# Patient Record
Sex: Male | Born: 1995 | Race: White | Hispanic: No | Marital: Single | Smoking: Never smoker
Health system: Southern US, Community
[De-identification: ages and names within clinical notes are randomized; demographics above are authoritative.]

## PROBLEM LIST (undated history)

## (undated) DIAGNOSIS — F909 Attention-deficit hyperactivity disorder, unspecified type: Secondary | ICD-10-CM

## (undated) DIAGNOSIS — Z87442 Personal history of urinary calculi: Secondary | ICD-10-CM

## (undated) HISTORY — PX: NO PAST SURGERIES: SHX2092

---
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
## 2017-02-02 ENCOUNTER — Emergency Department
Admission: EM | Admit: 2017-02-02 | Discharge: 2017-02-02 | Disposition: A | Discharge status: Home / Self Care | Payer: 59 | Attending: Emergency Medicine | Admitting: Emergency Medicine

## 2017-02-02 ENCOUNTER — Emergency Department: Payer: 59

## 2017-02-02 ENCOUNTER — Encounter: Payer: Self-pay | Admitting: Emergency Medicine

## 2017-02-02 DIAGNOSIS — M25571 Pain in right ankle and joints of right foot: Secondary | ICD-10-CM | POA: Insufficient documentation

## 2017-02-02 DIAGNOSIS — Y929 Unspecified place or not applicable: Secondary | ICD-10-CM | POA: Diagnosis not present

## 2017-02-02 DIAGNOSIS — W010XXA Fall on same level from slipping, tripping and stumbling without subsequent striking against object, initial encounter: Secondary | ICD-10-CM | POA: Insufficient documentation

## 2017-02-02 DIAGNOSIS — Z79899 Other long term (current) drug therapy: Secondary | ICD-10-CM | POA: Diagnosis not present

## 2017-02-02 DIAGNOSIS — Y93K1 Activity, walking an animal: Secondary | ICD-10-CM | POA: Diagnosis not present

## 2017-02-02 DIAGNOSIS — Y998 Other external cause status: Secondary | ICD-10-CM | POA: Diagnosis not present

## 2017-02-02 MED ORDER — NAPROXEN 500 MG PO TABS
500.0000 mg | ORAL_TABLET | Freq: Once | ORAL | Status: AC
  Administered 2017-02-02: 500 mg via ORAL
  Filled 2017-02-02: qty 1

## 2017-02-02 NOTE — ED Triage Notes (Signed)
Pt with right ankle pain after rolling it yesterday while walking a dog. Increased pain when bearing weight.

## 2017-02-02 NOTE — Discharge Instructions (Signed)
Continue taking over-the-counter ibuprofen, Motrin or Aleve as distracted, as needed for pain and inflammation.

## 2017-02-02 NOTE — ED Provider Notes (Signed)
[facility] Emergency Department Provider Note   ____________________________________________   I have reviewed the triage vital signs and the nursing notes.   HISTORY  Chief Complaint Ankle Pain    HPI Jeffery Everett is a 21 y.o. male presents emergency Department with right ankle swelling and pain after falling while walking a neighbors dog yesterday. Patient described a plantar flexion with inversion motion of the ankle during the misstep and fall. Since the injury patient notes pain at rest that increases with ankle motion and weightbearing. He localizes his pain to the lateral aspect of the ankle. Patient denies any past history of ankle sprains or fractures. Patient denies other injury sustained during the fall. Patient denies fever, chills, headache, vision changes, chest pain, chest tightness, shortness of breath, abdominal pain, nausea and vomiting.  Past Medical History:  Diagnosis Date  . ADHD (attention deficit hyperactivity disorder)     Patient Active Problem List   Diagnosis Date Noted  . Pilonidal cyst with abscess 11/25/2015    Past Surgical History:  Procedure Laterality Date  . NO PAST SURGERIES     Verified with patient (11/25/15)    Prior to Admission medications   Medication Sig Start Date End Date Taking? Authorizing Provider  amphetamine-dextroamphetamine (ADDERALL XR) 20 MG 24 hr capsule Take 20 mg by mouth daily.    [provider]  cetirizine (ZYRTEC) 10 MG tablet Take 10 mg by mouth every morning. 10/20/15   [provider]  clindamycin (CLEOCIN) 150 MG capsule Take 1 capsule by mouth 3 (three) times daily. 11/23/15   [provider]  fluticasone (FLONASE) 50 MCG/ACT nasal spray Place 1 spray into both nostrils daily. 10/20/15   [provider]  ibuprofen (ADVIL,MOTRIN) 600 MG tablet Take 1 tablet (600 mg total) by mouth every 6 (six) hours as needed. 05/21/15   Triplett, Rulon Eisenmengerari B, FNP    naproxen (NAPROSYN) 500 MG tablet Take 1 tablet (500 mg total) by mouth 2 (two) times daily with a meal. 12/29/15   Hagler, Jami L, PA-C    Allergies Patient has no known allergies.  Family History  Problem Relation Age of Onset  . Hypertension Mother   . ADD / ADHD Father   . Hypertension Paternal Grandmother   . Bipolar disorder Paternal Grandmother   . Fibromyalgia Paternal Grandmother   . COPD Paternal Grandmother   . Heart disease Paternal Grandfather     Social History Social History  Substance Use Topics  . Smoking status: Never Smoker  . Smokeless tobacco: Never Used  . Alcohol use No    Review of Systems Constitutional: Negative for fever/chills Eyes: No visual changes. Cardiovascular: Denies chest pain. Respiratory: Denies shortness of breath. Musculoskeletal: Right ankle pain and swelling.  Skin: Negative for rash. Neurological: Negative for headaches. ____________________________________________   PHYSICAL EXAM:  VITAL SIGNS: ED Triage Vitals  Enc Vitals Group     BP 02/02/17 1206 (!) 125/96     Pulse Rate 02/02/17 1206 77     Resp 02/02/17 1206 20     Temp 02/02/17 1206 98.3 F (36.8 C)     Temp Source 02/02/17 1206 Oral     SpO2 02/02/17 1206 99 %     Weight 02/02/17 1206 270 lb (122.5 kg)     Height --      Head Circumference --      Peak Flow --      Pain Score 02/02/17 1205 0     Pain Loc --  Pain Edu? --      Excl. in GC? --     Constitutional: Alert and oriented. Well appearing and in no acute distress.  Head: Normocephalic and atraumatic. Eyes: Conjunctivae are normal. PERRL.  Cardiovascular: Normal rate, regular rhythm. Normal distal pulses. Respiratory: Normal respiratory effort. Musculoskeletal: Right ankle pain localizes along the lateral malleoli over the anterior talofibular and calcaneal fibular ligaments with swelling and ecchymosis. Intact ankle range of motion with pain, no deformities noted. Negative pain over the fifth  metatarsal. Increased pain with range of motion and weightbearing of the right foot. Nontender with normal range of motion in all extremities. Neurologic: Normal speech and language.  Skin:  Skin is warm, dry and intact. No rash noted. Psychiatric: Mood and affect are normal.  ____________________________________________   LABS (all labs ordered are listed, but only abnormal results are displayed)  Labs Reviewed - No data to display ____________________________________________  EKG None ____________________________________________  RADIOLOGY DG right ankle complete FINDINGS: Frontal, oblique, and lateral views were obtained. There is soft tissue swelling, most marked laterally. There is no appreciable fracture or joint effusion. No appreciable joint space narrowing or erosion. Ankle mortise appears intact.  IMPRESSION: Soft tissue swelling. No evident fracture or arthropathic change. Ankle mortise appears intact. ____________________________________________   PROCEDURES  Procedure(s) performed:  SPLINT APPLICATION Date/Time: 3:02 PM Authorized by: Clois Comber Consent: Verbal consent obtained. Risks and benefits: risks, benefits and alternatives were discussed Consent given by: patient Splint applied by: EMT technician Location details: Right ankle  Splint type: Prefab stir-up splint  Supplies used: OCL stir-up splint Post-procedure: The splinted body part was neurovascularly unchanged following the procedure. Patient tolerance: Patient tolerated the procedure well with no immediate complications.      Critical Care performed: no ____________________________________________   INITIAL IMPRESSION / ASSESSMENT AND PLAN / ED COURSE  Pertinent labs & imaging results that were available during my care of the patient were reviewed by me and considered in my medical decision making (see chart for details).   Patient presented with right lateral ankle pain after  miss stepping and falling yesterday. Patient history, physical exam findings and imaging are reassuring of no acute fracture or neurovascular injury. Patient responded well to Naprosyn 500 mg by mouth given during course of care in the emergency department. Recommended patient continue OTC NSAIDs as needed for pain and inflammation until symptoms improve. Patient will also use crutches weightbearing as tolerated. Patient advised to follow up with Orthopedics for continued care and was also advised to return to the emergency department for symptoms that change or worsen.      ____________________________________________   FINAL CLINICAL IMPRESSION(S) / ED DIAGNOSES  Final diagnoses:  Acute right ankle pain       NEW MEDICATIONS STARTED DURING THIS VISIT:  New Prescriptions   No medications on file     Note:  This document was prepared using Dragon voice recognition software and may include unintentional dictation errors.    Clois Comber, PA-C 02/02/17 1504    Phineas Semen, MD 02/02/17 [phone]

## 2017-06-18 ENCOUNTER — Ambulatory Visit (INDEPENDENT_AMBULATORY_CARE_PROVIDER_SITE_OTHER): Payer: 59 | Admitting: Family Medicine

## 2017-06-18 ENCOUNTER — Encounter: Payer: Self-pay | Admitting: Family Medicine

## 2017-06-18 VITALS — BP 118/78 | HR 74 | Temp 98.1°F | Ht 74.0 in | Wt 277.8 lb

## 2017-06-18 DIAGNOSIS — E669 Obesity, unspecified: Secondary | ICD-10-CM | POA: Diagnosis not present

## 2017-06-18 DIAGNOSIS — F909 Attention-deficit hyperactivity disorder, unspecified type: Secondary | ICD-10-CM | POA: Diagnosis not present

## 2017-06-18 DIAGNOSIS — Z23 Encounter for immunization: Secondary | ICD-10-CM | POA: Diagnosis not present

## 2017-06-18 DIAGNOSIS — Z7689 Persons encountering health services in other specified circumstances: Secondary | ICD-10-CM | POA: Diagnosis not present

## 2017-06-18 NOTE — Progress Notes (Signed)
   Subjective:    Patient ID: Jeffery Everett, male    DOB: [birth year], 21 y.o.   MRN: [id number]  HPI This is a 21 yo male who presents today to establish care. He is accompanied by his mother. He lives with his parents and 699 yo brother. 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 Enjoys video games, sleeping, hanging out with friends. Occasionally walks for exercise.   Was diagnosed with ADHD when he was 7. Does not recall any testing in many years. Was prescribed Adderall by his previous pediatrician. Other ways he copes- chews gum, back ground noise. Manifests as fidgeting, inability to concentrate. Sleeps pretty well, feels rested "here and there."   Last eye exam 2017 Dental- not regular  Past Medical History:  Diagnosis Date  . ADHD (attention deficit hyperactivity disorder)    Past Surgical History:  Procedure Laterality Date  . NO PAST SURGERIES     Verified with patient (11/25/15)   Family History  Problem Relation Age of Onset  . Hypertension Mother   . ADD / ADHD Father   . Hypertension Paternal Grandmother   . Bipolar disorder Paternal Grandmother   . Fibromyalgia Paternal Grandmother   . COPD Paternal Grandmother   . Heart disease Paternal Grandfather    Social History   Tobacco Use  . Smoking status: Never Smoker  . Smokeless tobacco: Never Used  Substance Use Topics  . Alcohol use: No  . Drug use: No         Review of Systems  Gastrointestinal: Negative for abdominal pain, constipation, diarrhea, nausea and vomiting.  Musculoskeletal: Negative.   Neurological: Positive for headaches (infrequent).  Psychiatric/Behavioral: Positive for decreased concentration. The patient is nervous/anxious.        Objective:   Physical Exam  Constitutional: He is oriented to person, place, and time. He appears well-developed and well-nourished. No distress.  Obese.   HENT:  Head: Normocephalic and atraumatic.  Mouth/Throat: Oropharynx is clear and moist.  Poor  dentition  Eyes: Conjunctivae are normal.  Cardiovascular: Normal rate, regular rhythm and normal heart sounds.  Pulmonary/Chest: Effort normal and breath sounds normal.  Musculoskeletal: He exhibits no edema.  Neurological: He is alert and oriented to person, place, and time.  Skin: Skin is warm and dry. He is not diaphoretic.  Psychiatric: He has a normal mood and affect. His behavior is normal. Judgment and thought content normal.  Vitals reviewed.     BP 118/78   Pulse 74   Temp 98.1 F (36.7 C) (Oral)   Ht 6\' 2"  (1.88 m)   Wt 277 lb 12.8 oz (126 kg)   SpO2 95%   BMI 35.67 kg/m      Assessment & Plan:  1. Encounter to establish care  - Discussed and encouraged healthy lifestyle choices- adequate sleep, regular exercise, stress management and healthy food choices.  - will request records from prior provider  2. Flu vaccine need - Flu Vaccine QUAD 36+ mos IM  3. Attention deficit hyperactivity disorder (ADHD), unspecified ADHD type - will request records prior to restarting medication - encouraged him to learn more about diagnosis and discussed non-pharmacological coping strategies - follow up in 4-6 weeks to discuss treatment options   Olean Reeeborah Gessner, FNP-BC  [facility]  06/18/2017 8:54 AM

## 2017-06-18 NOTE — Patient Instructions (Signed)
It was a pleasure to meet you today  Please schedule a follow up visit in 4-6 weeks to allow me time to get your records  Keeping you healthy  Get these tests  Blood pressure- Have your blood pressure checked once a year by your healthcare provider.  Normal blood pressure is 120/80.  Weight- Have your body mass index (BMI) calculated to screen for obesity.  BMI is a measure of body fat based on height and weight. You can also calculate your own BMI at https://www.west-esparza.com/www.nhlbisupport.com/bmi/.  Cholesterol- Have your cholesterol checked regularly starting at age 19, sooner may be necessary if you have diabetes, high blood pressure, if a family member developed heart diseases at an early age or if you smoke.   Chlamydia, HIV, and other sexual transmitted disease- Get screened each year until the age of 19 then within three months of each new sexual partner.  Diabetes- Have your blood sugar checked regularly if you have high blood pressure, high cholesterol, a family history of diabetes or if you are overweight.  Get these vaccines  Flu shot- Every fall.  Tetanus shot- Every 10 years.  Menactra- Single dose; prevents meningitis.  Take these steps  Don't smoke- If you do smoke, ask your healthcare provider about quitting. For tips on how to quit, go to www.smokefree.gov or call 1-800-QUIT-NOW.  Be physically active- Exercise 5 days a week for at least 30 minutes.  If you are not already physically active start slow and gradually work up to 30 minutes of moderate physical activity.  Examples of moderate activity include walking briskly, mowing the yard, dancing, swimming bicycling, etc.  Eat a healthy diet- Eat a variety of healthy foods such as fruits, vegetables, low fat milk, low fat cheese, yogurt, lean meats, poultry, fish, beans, tofu, etc.  For more information on healthy eating, go to www.thenutritionsource.org  Drink alcohol in moderation- Limit alcohol intake two drinks or less a day.  Never  drink and drive.  Dentist- Brush and floss teeth twice daily; visit your dentis twice a year.  Depression-Your emotional health is as important as your physical health.  If you're feeling down, losing interest in things you normally enjoy please talk with your healthcare provider.  Gun Safety- If you keep a gun in your home, keep it unloaded and with the safety lock on.  Bullets should be stored separately.  Helmet use- Always wear a helmet when riding a motorcycle, bicycle, rollerblading or skateboarding.  Safe sex- If you may be exposed to a sexually transmitted infection, use a condom  Seat belts- Seat bels can save your life; always wear one.  Smoke/Carbon Monoxide detectors- These detectors need to be installed on the appropriate level of your home.  Replace batteries at least once a year.  Skin Cancer- When out in the sun, cover up and use sunscreen SPF 15 or higher.  Violence- If anyone is threatening or hurting you, please tell your healthcare provider.

## 2017-07-18 ENCOUNTER — Telehealth: Payer: Self-pay | Admitting: Family Medicine

## 2017-07-18 NOTE — Telephone Encounter (Signed)
Erroneous encounter

## 2017-07-27 ENCOUNTER — Ambulatory Visit (INDEPENDENT_AMBULATORY_CARE_PROVIDER_SITE_OTHER): Payer: 59 | Admitting: Family Medicine

## 2017-07-27 ENCOUNTER — Encounter: Payer: Self-pay | Admitting: Family Medicine

## 2017-07-27 ENCOUNTER — Encounter: Payer: Self-pay | Admitting: Emergency Medicine

## 2017-07-27 VITALS — BP 118/78 | HR 69 | Temp 97.9°F | Wt 284.0 lb

## 2017-07-27 DIAGNOSIS — F909 Attention-deficit hyperactivity disorder, unspecified type: Secondary | ICD-10-CM | POA: Diagnosis not present

## 2017-07-27 MED ORDER — AMPHETAMINE-DEXTROAMPHET ER 20 MG PO CP24: 20.0000 mg | ORAL_CAPSULE | Freq: Every day | ORAL | 0 refills | Status: DC

## 2017-07-27 NOTE — Patient Instructions (Signed)
It was good to see you today  Please let me know when you are down to about 2 weeks of last refill and I will do another 3 month supply  Follow up in 6 months  If you have any problems or questions, please let me knowj

## 2017-07-27 NOTE — Progress Notes (Signed)
   Subjective:    Patient ID: Jeffery Everett, male    DOB: 1995-10-30, 22 y.o.   MRN: [id number]  HPI This is a 22 yo male, accompanied by his mother, who presents today to discuss medication for ADHD.  I have received his records from previous provider documenting diagnosis and treatment. He was previously on adderall xr 20 mg daily and he reports that he had good results.  School resumes next week.  Past Medical History:  Diagnosis Date  . ADHD (attention deficit hyperactivity disorder)    Past Surgical History:  Procedure Laterality Date  . NO PAST SURGERIES     Verified with patient (11/25/15)   Family History  Problem Relation Age of Onset  . Hypertension Mother   . ADD / ADHD Father   . Hypertension Paternal Grandmother   . Bipolar disorder Paternal Grandmother   . Fibromyalgia Paternal Grandmother   . COPD Paternal Grandmother   . Heart disease Paternal Grandfather    Social History   Tobacco Use  . Smoking status: Never Smoker  . Smokeless tobacco: Never Used  Substance Use Topics  . Alcohol use: No  . Drug use: No      Review of Systems Per HPI    Objective:   Physical Exam  Constitutional: He is oriented to person, place, and time. He appears well-developed and well-nourished. No distress.  HENT:  Head: Normocephalic and atraumatic.  Eyes: Conjunctivae are normal.  Cardiovascular: Normal rate.  Pulmonary/Chest: Effort normal.  Neurological: He is alert and oriented to person, place, and time.  Skin: Skin is warm and dry. He is not diaphoretic.  Psychiatric: He has a normal mood and affect. His behavior is normal. Judgment and thought content normal.  Vitals reviewed.     BP 118/78 (BP Location: Left Arm, Patient Position: Sitting, Cuff Size: Large)   Pulse 69   Temp 97.9 F (36.6 C) (Oral)   Wt 284 lb (128.8 kg)   SpO2 97%   BMI 36.46 kg/m  Wt Readings from Last 3 Encounters:  07/27/17 284 lb (128.8 kg) (>99 %, Z= 2.88)*  06/18/17 277 lb  12.8 oz (126 kg) (>99 %, Z= 2.80)*  02/02/17 270 lb (122.5 kg) (>99 %, Z= 2.70)*   * Growth percentiles are based on CDC (Boys, 2-20 Years) data.       Assessment & Plan:  1. Attention deficit hyperactivity disorder (ADHD), unspecified ADHD type -Reviewed controlled substance policy and patient signed a contract -Will provide 3 months of prescriptions and patient can call for additional 3 months, he must be seen every 6 months but instructed him to follow-up sooner if he has any problems/side effects.  Encouraged him to sign up for my chart account. - amphetamine-dextroamphetamine (ADDERALL XR) 20 MG 24 hr capsule; Take 1 capsule (20 mg total) by mouth daily.  Dispense: 30 capsule; Refill: 0 - amphetamine-dextroamphetamine (ADDERALL XR) 20 MG 24 hr capsule; Take 1 capsule (20 mg total) by mouth daily.  Dispense: 30 capsule; Refill: 0 - amphetamine-dextroamphetamine (ADDERALL XR) 20 MG 24 hr capsule; Take 1 capsule (20 mg total) by mouth daily.  Dispense: 30 capsule; Refill: 0   Olean Reeeborah Jaima Janney, FNP-BC  [facility]  07/27/2017 8:47 AM

## 2018-01-25 ENCOUNTER — Ambulatory Visit (INDEPENDENT_AMBULATORY_CARE_PROVIDER_SITE_OTHER): Payer: 59 | Admitting: Family Medicine

## 2018-01-25 ENCOUNTER — Encounter: Payer: Self-pay | Admitting: Family Medicine

## 2018-01-25 VITALS — BP 106/64 | HR 76 | Temp 97.9°F | Ht 74.0 in | Wt 296.0 lb

## 2018-01-25 DIAGNOSIS — F909 Attention-deficit hyperactivity disorder, unspecified type: Secondary | ICD-10-CM | POA: Diagnosis not present

## 2018-01-25 DIAGNOSIS — E559 Vitamin D deficiency, unspecified: Secondary | ICD-10-CM

## 2018-01-25 DIAGNOSIS — Z0000 Encounter for general adult medical examination without abnormal findings: Secondary | ICD-10-CM

## 2018-01-25 DIAGNOSIS — Z6838 Body mass index (BMI) 38.0-38.9, adult: Secondary | ICD-10-CM | POA: Diagnosis not present

## 2018-01-25 DIAGNOSIS — E6609 Other obesity due to excess calories: Secondary | ICD-10-CM | POA: Diagnosis not present

## 2018-01-25 LAB — LIPID PANEL
CHOLESTEROL: 170 mg/dL (ref 0–200)
HDL: 53.6 mg/dL (ref >39.00)
LDL Cholesterol: 92 mg/dL (ref 0–99)
NonHDL: 116.72
Total CHOL/HDL Ratio: 3
Triglycerides: 124 mg/dL (ref 0.0–149.0)
VLDL: 24.8 mg/dL (ref 0.0–40.0)

## 2018-01-25 LAB — COMPREHENSIVE METABOLIC PANEL
ALBUMIN: 4.5 g/dL (ref 3.5–5.2)
ALK PHOS: 84 U/L (ref 52–171)
ALT: 56 U/L — ABNORMAL HIGH (ref 0–53)
AST: 24 U/L (ref 0–37)
BUN: 10 mg/dL (ref 6–23)
CO2: 30 mEq/L (ref 19–32)
Calcium: 9.3 mg/dL (ref 8.4–10.5)
Chloride: 103 mEq/L (ref 96–112)
Creatinine, Ser: 1.02 mg/dL (ref 0.40–1.50)
GFR: 99.1 mL/min (ref >60.00)
GLUCOSE: 95 mg/dL (ref 70–99)
POTASSIUM: 4 meq/L (ref 3.5–5.1)
Sodium: 141 mEq/L (ref 135–145)
TOTAL PROTEIN: 7 g/dL (ref 6.0–8.3)
Total Bilirubin: 0.5 mg/dL (ref 0.2–1.2)

## 2018-01-25 LAB — VITAMIN D 25 HYDROXY (VIT D DEFICIENCY, FRACTURES): VITD: 17.59 ng/mL — ABNORMAL LOW (ref 30.00–100.00)

## 2018-01-25 LAB — TSH: TSH: 2.42 u[IU]/mL (ref 0.40–5.00)

## 2018-01-25 LAB — HEMOGLOBIN A1C: HEMOGLOBIN A1C: 5.5 % (ref 4.6–6.5)

## 2018-01-25 MED ORDER — AMPHETAMINE-DEXTROAMPHET ER 20 MG PO CP24: 20.0000 mg | ORAL_CAPSULE | Freq: Every day | ORAL | 0 refills | Status: DC

## 2018-01-25 NOTE — Progress Notes (Signed)
Subjective:    Patient ID: Jeffery Everett, male    DOB: 09-08-1995, 22 y.o.   MRN: [id number]  HPI This is a 22 yo male, brought in by his mother, who presents today for CPE.   Was seen 07/27/17 and restarted on Adderall XR 20 mg daily He took a break from school and has been trying to find a job. 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   No concerns about health.   Mood has been "flip flopping," is easily irritated, plays video games to help with stress. Occasionally feels sad, not "full on depressed." No SI/HI. Sleeping 8-10 hours a night. Energy level is ok.   Wears seatbelt, drives, no texting and driving, no alcohol, no drugs. Never sexually active.   Headaches with loud noises, not every day. Last eye exam this year. New glasses prescription.   Diet- rare soda/juice, some junk food, most food prepared in the home.   Past Medical History:  Diagnosis Date  . ADHD (attention deficit hyperactivity disorder)    Past Surgical History:  Procedure Laterality Date  . NO PAST SURGERIES     Verified with patient (11/25/15)   Family History  Problem Relation Age of Onset  . Hypertension Mother   . ADD / ADHD Father   . Hypertension Paternal Grandmother   . Bipolar disorder Paternal Grandmother   . Fibromyalgia Paternal Grandmother   . COPD Paternal Grandmother   . Heart disease Paternal Grandfather    Social History   Tobacco Use  . Smoking status: Never Smoker  . Smokeless tobacco: Never Used  Substance Use Topics  . Alcohol use: No  . Drug use: Yes    Types: Morphine      Review of Systems  Constitutional: Negative.   HENT: Negative.   Eyes: Negative.   Respiratory: Negative.   Cardiovascular: Negative.   Gastrointestinal: Negative.   Endocrine: Negative.   Genitourinary: Negative.   Musculoskeletal: Negative.   Skin: Negative.   Allergic/Immunologic: Negative.   Neurological: Positive for headaches (occasional).    Hematological: Negative.   Psychiatric/Behavioral: Negative.        Objective:   Physical Exam Physical Exam  Constitutional: He is oriented to person, place, and time. He appears well-developed and well-nourished.  HENT:  Head: Normocephalic and atraumatic.  Right Ear: External ear normal.  Left Ear: External ear normal.  Nose: Nose normal.  Mouth/Throat: Oropharynx is clear and moist.  Eyes: Conjunctivae are normal. Pupils are equal, round, and reactive to light.  Neck: Normal range of motion. Neck supple.  Cardiovascular: Normal rate, regular rhythm, normal heart sounds and intact distal pulses.   Pulmonary/Chest: Effort normal and breath sounds normal.  Abdominal: Soft. Bowel sounds are normal. Hernia confirmed negative in the right inguinal area and confirmed negative in the left inguinal area.  Genitourinary: Testes normal and penis normal. Circumcised.  Musculoskeletal: Normal range of motion. He exhibits no edema or tenderness.       Cervical back: Normal.       Thoracic back: Normal.       Lumbar back: Normal.  Lymphadenopathy:    He has no cervical adenopathy.       Right: No inguinal adenopathy present.       Left: No inguinal adenopathy present.  Neurological: He is alert and oriented to person, place, and time. He has normal reflexes.  Skin: Skin is warm and dry.  Psychiatric: He has a normal  mood and affect. His behavior is normal. Judgment normal.  Vitals reviewed.     BP 106/64 (BP Location: Left Arm, Patient Position: Sitting, Cuff Size: Large)   Pulse 76   Temp 97.9 F (36.6 C) (Oral)   Ht 6\' 2"  (1.88 m)   Wt 296 lb (134.3 kg)   SpO2 98%   BMI 38.00 kg/m  Wt Readings from Last 3 Encounters:  01/25/18 296 lb (134.3 kg) (>99 %, Z= 3.02)*  07/27/17 284 lb (128.8 kg) (>99 %, Z= 2.88)*  06/18/17 277 lb 12.8 oz (126 kg) (>99 %, Z= 2.80)*   * Growth percentiles are based on CDC (Boys, 2-20 Years) data.       Assessment & Plan:  1. Annual physical  exam - Discussed and encouraged healthy lifestyle choices- adequate sleep, regular exercise, stress management and healthy food choices.   2. Attention deficit hyperactivity disorder (ADHD), unspecified ADHD type - follow up in 6 months - amphetamine-dextroamphetamine (ADDERALL XR) 20 MG 24 hr capsule; Take 1 capsule (20 mg total) by mouth daily.  Dispense: 30 capsule; Refill: 0 - amphetamine-dextroamphetamine (ADDERALL XR) 20 MG 24 hr capsule; Take 1 capsule (20 mg total) by mouth daily.  Dispense: 30 capsule; Refill: 0 - amphetamine-dextroamphetamine (ADDERALL XR) 20 MG 24 hr capsule; Take 1 capsule (20 mg total) by mouth daily.  Dispense: 30 capsule; Refill: 0  3. Class 2 obesity due to excess calories without serious comorbidity with body mass index (BMI) of 38.0 to 38.9 in adult - encouraged healthy food choices, daily exercise - Comprehensive metabolic panel - TSH - Vitamin D, 25-hydroxy - Hemoglobin A1c - Lipid Panel  [name], FNP-BC  [facility], [facility]  01/28/2018 7:53 AM

## 2018-01-25 NOTE — Patient Instructions (Addendum)
Good to see you today  Follow up in 6 months for follow up of ADHD  I will notify you of lab results, consider signing up for your Mychart account  Consider getting HPV vaccine- see attached information   Keeping you healthy  Get these tests  Blood pressure- Have your blood pressure checked once a year by your healthcare provider.  Normal blood pressure is 120/80.  Weight- Have your body mass index (BMI) calculated to screen for obesity.  BMI is a measure of body fat based on height and weight. You can also calculate your own BMI at https://www.west-esparza.com/.  Cholesterol- Have your cholesterol checked regularly starting at age 27, sooner may be necessary if you have diabetes, high blood pressure, if a family member developed heart diseases at an early age or if you smoke.   Chlamydia, HIV, and other sexual transmitted disease- Get screened each year until the age of 60 then within three months of each new sexual partner.  Diabetes- Have your blood sugar checked regularly if you have high blood pressure, high cholesterol, a family history of diabetes or if you are overweight.  Get these vaccines  Flu shot- Every fall.  Tetanus shot- Every 10 years.  Menactra- Single dose; prevents meningitis.  Take these steps  Don't smoke- If you do smoke, ask your healthcare provider about quitting. For tips on how to quit, go to www.smokefree.gov or call 1-800-QUIT-NOW.  Be physically active- Exercise 5 days a week for at least 30 minutes.  If you are not already physically active start slow and gradually work up to 30 minutes of moderate physical activity.  Examples of moderate activity include walking briskly, mowing the yard, dancing, swimming bicycling, etc.  Eat a healthy diet- Eat a variety of healthy foods such as fruits, vegetables, low fat milk, low fat cheese, yogurt, lean meats, poultry, fish, beans, tofu, etc.  For more information on healthy eating, go to  www.thenutritionsource.org  Drink alcohol in moderation- Limit alcohol intake two drinks or less a day.  Never drink and drive.  Dentist- Brush and floss teeth twice daily; visit your dentis twice a year.  Depression-Your emotional health is as important as your physical health.  If you're feeling down, losing interest in things you normally enjoy please talk with your healthcare provider.  Gun Safety- If you keep a gun in your home, keep it unloaded and with the safety lock on.  Bullets should be stored separately.  Helmet use- Always wear a helmet when riding a motorcycle, bicycle, rollerblading or skateboarding.  Safe sex- If you may be exposed to a sexually transmitted infection, use a condom  Seat belts- Seat bels can save your life; always wear one.  Smoke/Carbon Monoxide detectors- These detectors need to be installed on the appropriate level of your home.  Replace batteries at least once a year.  Skin Cancer- When out in the sun, cover up and use sunscreen SPF 15 or higher.  Violence- If anyone is threatening or hurting you, please tell your healthcare provider.  HPV (Human Papillomavirus) Vaccine: What You Need to Know 1. Why get vaccinated? HPV vaccine prevents infection with human papillomavirus (HPV) types that are associated with many cancers, including:  cervical cancer in females,  vaginal and vulvar cancers in females,  anal cancer in females and males,  throat cancer in females and males, and  penile cancer in males.  In addition, HPV vaccine prevents infection with HPV types that cause genital warts in both females  and males. In the [place] about 12,000 women get cervical cancer every year, and about 4,000 women die from it. HPV vaccine can prevent most of these cases of cervical cancer. Vaccination is not a substitute for cervical cancer screening. This vaccine does not protect against all HPV types that can cause cervical cancer. Women should still get regular  Pap tests. HPV infection usually comes from sexual contact, and most people will become infected at some point in their life. About 14 million Americans, including teens, get infected every year. Most infections will go away on their own and not cause serious problems. But thousands of women and men get cancer and other diseases from HPV. 2. HPV vaccine HPV vaccine is approved by FDA and is recommended by CDC for both males and females. It is routinely given at 75 or 20 years of age, but it may be given beginning at age 57 years through age 85 years. Most adolescents 9 through 20 years of age should get HPV vaccine as a two-dose series with the doses separated by 6-12 months. People who start HPV vaccination at 16 years of age and older should get the vaccine as a three-dose series with the second dose given 1-2 months after the first dose and the third dose given 6 months after the first dose. There are several exceptions to these age recommendations. Your health care provider can give you more information. 3. Some people should not get this vaccine  Anyone who has had a severe (life-threatening) allergic reaction to a dose of HPV vaccine should not get another dose.  Anyone who has a severe (life threatening) allergy to any component of HPV vaccine should not get the vaccine.  Tell your doctor if you have any severe allergies that you know of, including a severe allergy to yeast.  HPV vaccine is not recommended for pregnant women. If you learn that you were pregnant when you were vaccinated, there is no reason to expect any problems for you or your baby. Any woman who learns she was pregnant when she got HPV vaccine is encouraged to contact the manufacturer's registry for HPV vaccination during pregnancy at (815) 460-5853. Women who are breastfeeding may be vaccinated.  If you have a mild illness, such as a cold, you can probably get the vaccine today. If you are moderately or severely ill, you  should probably wait until you recover. Your doctor can advise you. 4. Risks of a vaccine reaction With any medicine, including vaccines, there is a chance of side effects. These are usually mild and go away on their own, but serious reactions are also possible. Most people who get HPV vaccine do not have any serious problems with it. Mild or moderate problems following HPV vaccine:  Reactions in the arm where the shot was given: ? Soreness (about 9 people in 10) ? Redness or swelling (about 1 person in 3)  Fever: ? Mild (100F) (about 1 person in 10) ? Moderate (102F) (about 1 person in 39)  Other problems: ? Headache (about 1 person in 3) Problems that could happen after any injected vaccine:  People sometimes faint after a medical procedure, including vaccination. Sitting or lying down for about 15 minutes can help prevent fainting, and injuries caused by a fall. Tell your doctor if you feel dizzy, or have vision changes or ringing in the ears.  Some people get severe pain in the shoulder and have difficulty moving the arm where a shot was given. This happens very  rarely.  Any medication can cause a severe allergic reaction. Such reactions from a vaccine are very rare, estimated at about 1 in a million doses, and would happen within a few minutes to a few hours after the vaccination. As with any medicine, there is a very remote chance of a vaccine causing a serious injury or death. The safety of vaccines is always being monitored. For more information, visit: http://floyd.org/www.cdc.gov/vaccinesafety/. 5. What if there is a serious reaction? What should I look for? Look for anything that concerns you, such as signs of a severe allergic reaction, very high fever, or unusual behavior. Signs of a severe allergic reaction can include hives, swelling of the face and throat, difficulty breathing, a fast heartbeat, dizziness, and weakness. These would usually start a few minutes to a few hours after the  vaccination. What should I do? If you think it is a severe allergic reaction or other emergency that can't wait, call 9-1-1 or get to the nearest hospital. Otherwise, call your doctor. Afterward, the reaction should be reported to the Vaccine Adverse Event Reporting System (VAERS). Your doctor should file this report, or you can do it yourself through the VAERS web site at www.vaers.LAgents.nohhs.gov, or by calling 1-720-816-5082. VAERS does not give medical advice. 6. The National Vaccine Injury Compensation Program The [employer] Vaccine Injury Compensation Program (VICP) is a federal program that was created to compensate people who may have been injured by certain vaccines. Persons who believe they may have been injured by a vaccine can learn about the program and about filing a claim by calling 1-(224)441-8809 or visiting the VICP website at SpiritualWord.atwww.hrsa.gov/vaccinecompensation. There is a time limit to file a claim for compensation. 7. How can I learn more?  Ask your health care provider. He or she can give you the vaccine package insert or suggest other sources of information.  Call your local or state health department.  Contact the Centers for Disease Control and Prevention (CDC): ? Call 484-094-93021-4584042681 (1-800-CDC-INFO) or ? Visit CDC's website at RunningConvention.dewww.cdc.gov/hpv Vaccine Information Statement, HPV Vaccine (06/25/2015) This information is not intended to replace advice given to you by your health care provider. Make sure you discuss any questions you have with your health care provider. Document Released: 02/04/2014 Document Revised: 03/30/2016 Document Reviewed: 03/30/2016 Elsevier Interactive Patient Education  2017 [employer]

## 2018-01-27 MED ORDER — VITAMIN D (ERGOCALCIFEROL) 1.25 MG (50000 UNIT) PO CAPS: 50000.0000 [IU] | ORAL_CAPSULE | ORAL | 1 refills | Status: DC

## 2018-01-28 ENCOUNTER — Encounter: Payer: Self-pay | Admitting: Family Medicine

## 2018-07-05 ENCOUNTER — Other Ambulatory Visit: Payer: Self-pay | Admitting: Family Medicine

## 2018-07-05 DIAGNOSIS — E559 Vitamin D deficiency, unspecified: Secondary | ICD-10-CM

## 2018-07-05 NOTE — Telephone Encounter (Signed)
Last filled 02/06/18 #12 refills 1  Last OV 01/27/18   Last Vit D draw 01/25/18

## 2018-07-05 NOTE — Telephone Encounter (Signed)
Will discuss at upcoming follow up appointment.

## 2018-07-29 ENCOUNTER — Ambulatory Visit (INDEPENDENT_AMBULATORY_CARE_PROVIDER_SITE_OTHER): Payer: 59 | Admitting: Family Medicine

## 2018-07-29 ENCOUNTER — Encounter: Payer: Self-pay | Admitting: Family Medicine

## 2018-07-29 VITALS — BP 114/74 | HR 72 | Temp 98.3°F | Ht 74.0 in | Wt 328.5 lb

## 2018-07-29 DIAGNOSIS — L309 Dermatitis, unspecified: Secondary | ICD-10-CM | POA: Diagnosis not present

## 2018-07-29 DIAGNOSIS — Z23 Encounter for immunization: Secondary | ICD-10-CM | POA: Diagnosis not present

## 2018-07-29 DIAGNOSIS — Z6841 Body Mass Index (BMI) 40.0 and over, adult: Secondary | ICD-10-CM

## 2018-07-29 DIAGNOSIS — Z114 Encounter for screening for human immunodeficiency virus [HIV]: Secondary | ICD-10-CM

## 2018-07-29 DIAGNOSIS — E559 Vitamin D deficiency, unspecified: Secondary | ICD-10-CM

## 2018-07-29 DIAGNOSIS — F909 Attention-deficit hyperactivity disorder, unspecified type: Secondary | ICD-10-CM

## 2018-07-29 LAB — VITAMIN D 25 HYDROXY (VIT D DEFICIENCY, FRACTURES): VITD: 20.33 ng/mL — AB (ref 30.00–100.00)

## 2018-07-29 MED ORDER — VITAMIN D3 1.25 MG (50000 UT) PO TABS: 1.0000 | ORAL_TABLET | ORAL | 3 refills | Status: DC

## 2018-07-29 NOTE — Progress Notes (Signed)
Subjective:    Patient ID: Jeffery Everett, male    DOB: [birth year], 22 y.o.   MRN: [id number]  HPI This is a 23 yo male who presents today for follow up of ADHD, low vitamin D.   ADHD- not currently enrolled in school. Decided to change from culinary program to automotive. Is planning to go back to school this fall. Has not felt that he needs medication when not in school. 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 Is thinking about getting a job at [employer].   Vitamin D deficiency- has been taking weekly supplementation irregularly.   Weight gain- has not been as active. Drinks a bottle of soda in about 4 days. Tries to alternate with water. Has family meals but tends to eat prepared foods.   He denies alcohol, drug use. Denies depression, anxiety  Past Medical History:  Diagnosis Date  . ADHD (attention deficit hyperactivity disorder)    Past Surgical History:  Procedure Laterality Date  . NO PAST SURGERIES     Verified with patient (11/25/15)   Family History  Problem Relation Age of Onset  . Hypertension Mother   . ADD / ADHD Father   . Hypertension Paternal Grandmother   . Bipolar disorder Paternal Grandmother   . Fibromyalgia Paternal Grandmother   . COPD Paternal Grandmother   . Heart disease Paternal Grandfather    Social History   Tobacco Use  . Smoking status: Never Smoker  . Smokeless tobacco: Never Used  Substance Use Topics  . Alcohol use: No  . Drug use: Yes    Types: Morphine      Review of Systems Per HP    Objective:   Physical Exam Vitals signs reviewed.  Constitutional:      Appearance: He is obese.  HENT:     Head: Normocephalic and atraumatic.  Cardiovascular:     Rate and Rhythm: Normal rate.  Pulmonary:     Effort: Pulmonary effort is normal.  Neurological:     Mental Status: He is alert and oriented to person, place, and time.  Psychiatric:        Mood and Affect: Mood normal.         Behavior: Behavior normal.        Thought Content: Thought content normal.        Judgment: Judgment normal.       BP 114/74   Pulse 72   Temp 98.3 F (36.8 C) (Oral)   Ht 6\' 2"  (1.88 m)   Wt (!) 328 lb 8 oz (149 kg)   BMI 42.18 kg/m  Wt Readings from Last 3 Encounters:  07/29/18 (!) 328 lb 8 oz (149 kg)  01/25/18 296 lb (134.3 kg) (>99 %, Z= 3.02)*  07/27/17 284 lb (128.8 kg) (>99 %, Z= 2.88)*   * Growth percentiles are based on CDC (Boys, 2-20 Years) data.       Assessment & Plan:  1. Vitamin D deficiency - Vitamin D, 25-hydroxy - Cholecalciferol (VITAMIN D3) 1.25 MG (50000 UT) TABS; Take 1 tablet by mouth every 7 (seven) days.  Dispense: 12 tablet; Refill: 3  2. Class 3 severe obesity due to excess calories without serious comorbidity with body mass index (BMI) of 40.0 to 44.9 in adult [facility]) - discussed recent weight gain and encouraged him to eliminate soda and sweet tea, decrease fast/processed foods, increase vegetables and fruits, increase activity - follow up in 6 months  3. Screening for HIV without presence of risk factors - HIV Antibody (routine testing w rflx)  4. Need for influenza vaccination - Flu Vaccine QUAD 6+ mos PF IM (Fluarix Quad PF)  5. ADHD - does not currently feel that he needs medication, will address at 6 month follow up if he is planning to enroll in classes  Olean Ree, FNP-BC  [facility] at [facility], [facility]  07/29/2018 12:30 PM

## 2018-07-29 NOTE — Patient Instructions (Signed)
Good to see you today  Please stop at the lab  Stop at the front desk and schedule a 6 month follow up visit  Please increase your vegetables and fruits, decrease soda and sweet tea. Try to get some exercise at least every other day- walking for 20-30 minutes is great!

## 2018-07-30 LAB — HIV ANTIBODY (ROUTINE TESTING W REFLEX): HIV 1&2 Ab, 4th Generation: NONREACTIVE

## 2019-01-27 ENCOUNTER — Encounter: Payer: Self-pay | Admitting: Family Medicine

## 2019-01-27 ENCOUNTER — Other Ambulatory Visit: Payer: Self-pay

## 2019-01-27 ENCOUNTER — Ambulatory Visit (INDEPENDENT_AMBULATORY_CARE_PROVIDER_SITE_OTHER): Payer: 59 | Admitting: Family Medicine

## 2019-01-27 VITALS — BP 122/82 | HR 77 | Temp 98.2°F | Wt 322.0 lb

## 2019-01-27 DIAGNOSIS — E559 Vitamin D deficiency, unspecified: Secondary | ICD-10-CM

## 2019-01-27 DIAGNOSIS — F909 Attention-deficit hyperactivity disorder, unspecified type: Secondary | ICD-10-CM | POA: Diagnosis not present

## 2019-01-27 DIAGNOSIS — E66812 Obesity, class 2: Secondary | ICD-10-CM

## 2019-01-27 DIAGNOSIS — E669 Obesity, unspecified: Secondary | ICD-10-CM

## 2019-01-27 LAB — VITAMIN D 25 HYDROXY (VIT D DEFICIENCY, FRACTURES): VITD: 17.89 ng/mL — ABNORMAL LOW (ref 30.00–100.00)

## 2019-01-27 NOTE — Patient Instructions (Signed)
Good to see you today  I will call you with your vitamin D level.   Follow up in 6 months for your annual physical exam

## 2019-01-27 NOTE — Progress Notes (Signed)
   Subjective:    Patient ID: Jeffery Everett, male    DOB: November 20, 1995, 23 y.o.   MRN: [id number]  HPI This is a 23 yo male who presents today for follow up of vid d deficiency, ADHD. 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 He is staying with a friend. Some extra people have moved in with his family.  Getting some more activity with friend's dogs. Mostly eating food that is home cooked, some fast food and soda.  Mood is good. No problems with attention since he is not in school. Is able to complete necessary tasks. Unsure when he will go back.   Past Medical History:  Diagnosis Date  . ADHD (attention deficit hyperactivity disorder)    Past Surgical History:  Procedure Laterality Date  . NO PAST SURGERIES     Verified with patient (11/25/15)   Family History  Problem Relation Age of Onset  . Hypertension Mother   . ADD / ADHD Father   . Hypertension Paternal Grandmother   . Bipolar disorder Paternal Grandmother   . Fibromyalgia Paternal Grandmother   . COPD Paternal Grandmother   . Heart disease Paternal Grandfather    Social History   Tobacco Use  . Smoking status: Never Smoker  . Smokeless tobacco: Never Used  Substance Use Topics  . Alcohol use: No  . Drug use: Yes    Types: Morphine      Review of Systems   per HPI  Objective:   Physical Exam Vitals signs reviewed.  Constitutional:      General: He is not in acute distress.    Appearance: Normal appearance. He is obese. He is not ill-appearing, toxic-appearing or diaphoretic.  HENT:     Head: Normocephalic and atraumatic.  Eyes:     Conjunctiva/sclera: Conjunctivae normal.  Cardiovascular:     Rate and Rhythm: Normal rate.  Pulmonary:     Effort: Pulmonary effort is normal.  Neurological:     Mental Status: He is alert.       BP 122/82   Pulse 77   Temp 98.2 F (36.8 C) (Oral)   Wt (!) 322 lb (146.1 kg)   SpO2 98%   BMI 41.34 kg/m   Wt Readings from Last 3 Encounters:  01/27/19 (!) 322 lb (146.1 kg)  07/29/18 (!) 328 lb 8 oz (149 kg)  01/25/18 296 lb (134.3 kg) (>99 %, Z= 3.02)*   * Growth percentiles are based on CDC (Boys, 2-20 Years) data.   .    Assessment & Plan:  1. Vitamin D deficiency - compliant with supplementation, will recheck level - Vitamin D, 25-hydroxy  2. Obesity, Class II, BMI 35-39.9 - encouraged him to continue to make healthy food choices, including eliminating soda  3. Attention deficit hyperactivity disorder (ADHD), unspecified ADHD type - not problematic currently  - follow up in 6 months for CPE  Clarene Reamer, FNP-BC   [facility] at [facility], [place]  01/27/2019 12:17 PM

## 2019-01-30 ENCOUNTER — Telehealth: Payer: Self-pay

## 2019-01-30 NOTE — Telephone Encounter (Signed)
Left message for patient to call back  

## 2019-02-07 ENCOUNTER — Encounter: Payer: Self-pay | Admitting: Family Medicine

## 2019-07-30 ENCOUNTER — Other Ambulatory Visit: Payer: Self-pay

## 2019-07-30 ENCOUNTER — Encounter: Payer: Self-pay | Admitting: Family Medicine

## 2019-07-30 ENCOUNTER — Ambulatory Visit (INDEPENDENT_AMBULATORY_CARE_PROVIDER_SITE_OTHER): Payer: Self-pay | Admitting: Family Medicine

## 2019-07-30 VITALS — BP 116/78 | HR 75 | Temp 97.6°F | Ht 74.0 in | Wt 325.1 lb

## 2019-07-30 DIAGNOSIS — F909 Attention-deficit hyperactivity disorder, unspecified type: Secondary | ICD-10-CM

## 2019-07-30 DIAGNOSIS — Z0000 Encounter for general adult medical examination without abnormal findings: Secondary | ICD-10-CM

## 2019-07-30 DIAGNOSIS — E669 Obesity, unspecified: Secondary | ICD-10-CM

## 2019-07-30 DIAGNOSIS — E559 Vitamin D deficiency, unspecified: Secondary | ICD-10-CM

## 2019-07-30 DIAGNOSIS — Z23 Encounter for immunization: Secondary | ICD-10-CM

## 2019-07-30 DIAGNOSIS — F329 Major depressive disorder, single episode, unspecified: Secondary | ICD-10-CM

## 2019-07-30 DIAGNOSIS — F419 Anxiety disorder, unspecified: Secondary | ICD-10-CM

## 2019-07-30 LAB — LIPID PANEL
Cholesterol: 190 mg/dL (ref 0–200)
HDL: 62.5 mg/dL (ref >39.00)
LDL Cholesterol: 111 mg/dL — ABNORMAL HIGH (ref 0–99)
NonHDL: 127.09
Total CHOL/HDL Ratio: 3
Triglycerides: 82 mg/dL (ref 0.0–149.0)
VLDL: 16.4 mg/dL (ref 0.0–40.0)

## 2019-07-30 LAB — HEMOGLOBIN A1C: Hgb A1c MFr Bld: 5.5 % (ref 4.6–6.5)

## 2019-07-30 LAB — VITAMIN D 25 HYDROXY (VIT D DEFICIENCY, FRACTURES): VITD: 12.85 ng/mL — ABNORMAL LOW (ref 30.00–100.00)

## 2019-07-30 MED ORDER — AMPHETAMINE-DEXTROAMPHET ER 20 MG PO CP24: 20.0000 mg | ORAL_CAPSULE | Freq: Every day | ORAL | 0 refills | Status: DC

## 2019-07-30 NOTE — Patient Instructions (Signed)
Good to see you today  Follow up in 3 months  I will notify you of lab results  Work on non medication treatments for anxiety- regular exercise, meditation/yoga, eat healthy foods, decrease soda consumption.    Preventive Care 61-22 Years Old, Male Preventive care refers to lifestyle choices and visits with your health care provider that can promote health and wellness. At this stage in your life, you may start seeing a primary care physician instead of a pediatrician. Your health care is now your responsibility. Preventive care for young adults includes:  A yearly physical exam. This is also called an annual wellness visit.  Regular dental and eye exams.  Immunizations.  Screening for certain conditions.  Healthy lifestyle choices, such as diet and exercise. What can I expect for my preventive care visit? Physical exam Your health care provider may check:  Height and weight. These may be used to calculate body mass index (BMI), which is a measurement that tells if you are at a healthy weight.  Heart rate and blood pressure.  Body temperature. Counseling Your health care provider may ask you questions about:  Past medical problems and family medical history.  Alcohol, tobacco, and drug use.  Home and relationship well-being.  Access to firearms.  Emotional well-being.  Diet, exercise, and sleep habits.  Sexual activity and sexual health. What immunizations do I need?  Influenza (flu) vaccine  This is recommended every year. Tetanus, diphtheria, and pertussis (Tdap) vaccine  You may need a Td booster every 10 years. Varicella (chickenpox) vaccine  You may need this vaccine if you have not already been vaccinated. Human papillomavirus (HPV) vaccine  If recommended by your health care provider, you may need three doses over 6 months. Measles, mumps, and rubella (MMR) vaccine  You may need at least one dose of MMR. You may also need a second dose.  Meningococcal conjugate (MenACWY) vaccine  One dose is recommended if you are 38-73 years old and a Market researcher living in a residence hall, or if you have one of several medical conditions. You may also need additional booster doses. Pneumococcal conjugate (PCV13) vaccine  You may need this if you have certain conditions and were not previously vaccinated. Pneumococcal polysaccharide (PPSV23) vaccine  You may need one or two doses if you smoke cigarettes or if you have certain conditions. Hepatitis A vaccine  You may need this if you have certain conditions or if you travel or work in places where you may be exposed to hepatitis A. Hepatitis B vaccine  You may need this if you have certain conditions or if you travel or work in places where you may be exposed to hepatitis B. Haemophilus influenzae type b (Hib) vaccine  You may need this if you have certain risk factors. You may receive vaccines as individual doses or as more than one vaccine together in one shot (combination vaccines). Talk with your health care provider about the risks and benefits of combination vaccines. What tests do I need? Blood tests  Lipid and cholesterol levels. These may be checked every 5 years starting at age 73.  Hepatitis C test.  Hepatitis B test. Screening  Genital exam to check for testicular cancer or hernias.  Sexually transmitted disease (STD) testing, if you are at risk. Other tests  Tuberculosis skin test.  Vision and hearing tests.  Skin exam. Follow these instructions at home: Eating and drinking   Eat a diet that includes fresh fruits and vegetables, whole grains,  lean protein, and low-fat dairy products.  Drink enough fluid to keep your urine pale yellow.  Do not drink alcohol if: ? Your health care provider tells you not to drink. ? You are under the legal drinking age. In the [place] the legal drinking age is 32.  If you drink alcohol: ? Limit how much you  have to 0-2 drinks a day. ? Be aware of how much alcohol is in your drink. In the [place] one drink equals one 12 oz bottle of beer (355 mL), one 5 oz glass of wine (148 mL), or one 1 oz glass of hard liquor (44 mL). Lifestyle  Take daily care of your teeth and gums.  Stay active. Exercise at least 30 minutes 5 or more days of the week.  Do not use any products that contain nicotine or tobacco, such as cigarettes, e-cigarettes, and chewing tobacco. If you need help quitting, ask your health care provider.  Do not use drugs.  If you are sexually active, practice safe sex. Use a condom or other form of protection to prevent STIs (sexually transmitted infections).  Find healthy ways to cope with stress, such as: ? Meditation, yoga, or listening to music. ? Journaling. ? Talking to a trusted person. ? Spending time with friends and family. Safety  Always wear your seat belt while driving or riding in a vehicle.  Do not drive if you have been drinking alcohol.  Do not ride with someone who has been drinking.  Do not drive when you are tired or distracted.  Do not text while driving.  Wear a helmet and other protective equipment during sports activities.  If you have firearms in your house, make sure you follow all gun safety procedures.  Seek help if you have been bullied, physically abused, or sexually abused.  Use the Internet responsibly to avoid dangers such as online bullying and online sex predators. What's next?  Go to your health care provider once a year for a well check visit.  Ask your health care provider how often you should have your eyes and teeth checked.  Stay up to date on all vaccines. This information is not intended to replace advice given to you by your health care provider. Make sure you discuss any questions you have with your health care provider. Document Revised: 07/04/2018 Document Reviewed: 07/04/2018 Elsevier Patient Education  2020 [employer].

## 2019-07-30 NOTE — Progress Notes (Signed)
Subjective:    Patient ID: Jeffery Everett, male    DOB: July 18, 1996, 24 y.o.   MRN: [id number]  HPI Chief Complaint  Patient presents with  . Annual Exam    Wants to start back on Adderall - has been off x 3 years. Pt has noticed its hard to focus at work and multitask. Pt also dealing with Anxiety related to work and some personal issues with abnormal daydreams   This is a 24 yo male who presents today for CPE.    Last CPE- 01/25/2018 Tdap- 12/31/2009 Flu- today Dental- regular Eye- annual Exercise- active at work, some walking  Working at [employer] in [place]. Working 24 hours a week. Job is ok. Can have difficulty keeping up with tasks. Occasionally helps his dad. Living with his parents. Going ok. Mood has been like it was in high school. A little irritable and depressed. Feels anxious at work. When it is bad, he "freezes." Can't function at work. Tries to get away if possible. Has used deep breathing/ tapping.  Playing video games. Has been walking more and more active at work. Eating at work. Drinking lots of soda. Sleep is "fine," sleeping 7-9 hours a night.   Past Medical History:  Diagnosis Date  . ADHD (attention deficit hyperactivity disorder)    Past Surgical History:  Procedure Laterality Date  . NO PAST SURGERIES     Verified with patient (11/25/15)   Family History  Problem Relation Age of Onset  . Hypertension Mother   . ADD / ADHD Father   . Hypertension Paternal Grandmother   . Bipolar disorder Paternal Grandmother   . Fibromyalgia Paternal Grandmother   . COPD Paternal Grandmother   . Heart disease Paternal Grandfather       Review of Systems  Constitutional: Negative.   HENT: Negative.   Eyes: Negative.   Respiratory: Negative.   Cardiovascular: Negative.   Gastrointestinal: Negative.   Endocrine: Negative.   Genitourinary: Negative.   Musculoskeletal: Negative.   Skin: Negative.   Allergic/Immunologic: Negative.   Neurological: Positive for  headaches (occasional with loud noises).  Hematological: Negative.   Psychiatric/Behavioral: Positive for decreased concentration and dysphoric mood. The patient is nervous/anxious.        Objective:   Physical Exam Physical Exam  Constitutional: He is oriented to person, place, and time. He appears well-developed and well-nourished. Obese.  HENT:  Head: Normocephalic and atraumatic.  Right Ear: External ear normal.  Left Ear: External ear normal.  Eyes: Conjunctivae are normal.  Neck: Normal range of motion. Neck supple.  Cardiovascular: Normal rate, regular rhythm, normal heart sounds and intact distal pulses.   Pulmonary/Chest: Effort normal and breath sounds normal.  Abdominal: Soft. Bowel sounds are normal. Hernia confirmed negative in the right inguinal area and confirmed negative in the left inguinal area.  Genitourinary: Testes normal and penis normal. Circumcised.  Musculoskeletal: Normal range of motion. He exhibits no edema or tenderness.       Cervical back: Normal.       Thoracic back: Normal.       Lumbar back: Normal.  Lymphadenopathy:    He has no cervical adenopathy.       Right: No inguinal adenopathy present.       Left: No inguinal adenopathy present.  Neurological: He is alert and oriented to person, place, and time. He has normal reflexes.  Skin: Skin is warm and dry.  Psychiatric: He has a normal mood and affect. His behavior is normal. Judgment  normal.  Vitals reviewed.     BP 116/78 (BP Location: Left Arm, Patient Position: Sitting, Cuff Size: Normal)   Pulse 75   Temp 97.6 F (36.4 C) (Temporal)   Ht 6\' 2"  (1.88 m)   Wt (!) 325 lb 1.9 oz (147.5 kg)   SpO2 98%   BMI 41.74 kg/m  Wt Readings from Last 3 Encounters:  07/30/19 (!) 325 lb 1.9 oz (147.5 kg)  01/27/19 (!) 322 lb (146.1 kg)  07/29/18 (!) 328 lb 8 oz (149 kg)   GAD 7 : Generalized Anxiety Score 07/30/2019  Nervous, Anxious, on Edge 0  Control/stop worrying 0  Worry too much -  different things 0  Trouble relaxing 0  Restless 0  Easily annoyed or irritable 3  Afraid - awful might happen 1  Total GAD 7 Score 4    Depression screen [facility] 2/9 07/30/2019 07/27/2017  Decreased Interest 1 0  Down, Depressed, Hopeless 0 0  PHQ - 2 Score 1 0       Assessment & Plan:  1. Annual physical exam - Discussed and encouraged healthy lifestyle choices- adequate sleep, regular exercise, stress management and healthy food choices.    2. Attention deficit hyperactivity disorder (ADHD), unspecified ADHD type - amphetamine-dextroamphetamine (ADDERALL XR) 20 MG 24 hr capsule; Take 1 capsule (20 mg total) by mouth daily.  Dispense: 30 capsule; Refill: 0 - amphetamine-dextroamphetamine (ADDERALL XR) 20 MG 24 hr capsule; Take 1 capsule (20 mg total) by mouth daily.  Dispense: 30 capsule; Refill: 0 - amphetamine-dextroamphetamine (ADDERALL XR) 20 MG 24 hr capsule; Take 1 capsule (20 mg total) by mouth daily.  Dispense: 30 capsule; Refill: 0  3. Class 3 obesity - encouraged decreased soda and fast food intake - Hemoglobin A1c - Lipid Panel  4. Vitamin D deficiency - has not been taking supplement, difficulty remembering - Vitamin D, 25-hydroxy  5. Anxiety and depression - will treat ADHD and see if improved. Discussed non pharmacologic treatments - follow up in 3 months, sooner if worsening symptoms  This visit occurred during the SARS-CoV-2 public health emergency.  Safety protocols were in place, including screening questions prior to the visit, additional usage of staff PPE, and extensive cleaning of exam room while observing appropriate contact time as indicated for disinfecting solutions.      Clarene Reamer, FNP-BC  [facility]  07/30/2019 11:35 AM

## 2019-08-29 ENCOUNTER — Encounter: Payer: Self-pay | Admitting: Emergency Medicine

## 2019-08-29 ENCOUNTER — Emergency Department: Payer: BC Managed Care – PPO

## 2019-08-29 ENCOUNTER — Emergency Department
Admission: EM | Admit: 2019-08-29 | Discharge: 2019-08-29 | Disposition: A | Discharge status: Home / Self Care | Payer: BC Managed Care – PPO | Attending: Emergency Medicine | Admitting: Emergency Medicine

## 2019-08-29 ENCOUNTER — Other Ambulatory Visit: Payer: Self-pay

## 2019-08-29 DIAGNOSIS — Z79899 Other long term (current) drug therapy: Secondary | ICD-10-CM | POA: Diagnosis not present

## 2019-08-29 DIAGNOSIS — N201 Calculus of ureter: Secondary | ICD-10-CM

## 2019-08-29 DIAGNOSIS — R109 Unspecified abdominal pain: Secondary | ICD-10-CM | POA: Diagnosis present

## 2019-08-29 LAB — COMPREHENSIVE METABOLIC PANEL
ALT: 86 U/L — ABNORMAL HIGH (ref 0–44)
AST: 39 U/L (ref 15–41)
Albumin: 4.7 g/dL (ref 3.5–5.0)
Alkaline Phosphatase: 76 U/L (ref 38–126)
Anion gap: 10 (ref 5–15)
BUN: 14 mg/dL (ref 6–20)
CO2: 26 mmol/L (ref 22–32)
Calcium: 9.6 mg/dL (ref 8.9–10.3)
Chloride: 106 mmol/L (ref 98–111)
Creatinine, Ser: 1.1 mg/dL (ref 0.61–1.24)
GFR calc Af Amer: >60 mL/min (ref >60)
GFR calc non Af Amer: >60 mL/min (ref >60)
Glucose, Bld: 116 mg/dL — ABNORMAL HIGH (ref 70–99)
Potassium: 4.2 mmol/L (ref 3.5–5.1)
Sodium: 142 mmol/L (ref 135–145)
Total Bilirubin: 0.7 mg/dL (ref 0.3–1.2)
Total Protein: 7.8 g/dL (ref 6.5–8.1)

## 2019-08-29 LAB — CBC
HCT: 43.2 % (ref 39.0–52.0)
Hemoglobin: 14.3 g/dL (ref 13.0–17.0)
MCH: 30.4 pg (ref 26.0–34.0)
MCHC: 33.1 g/dL (ref 30.0–36.0)
MCV: 91.9 fL (ref 80.0–100.0)
Platelets: 259 10*3/uL (ref 150–400)
RBC: 4.7 MIL/uL (ref 4.22–5.81)
RDW: 12.2 % (ref 11.5–15.5)
WBC: 7.2 10*3/uL (ref 4.0–10.5)
nRBC: 0 % (ref 0.0–0.2)

## 2019-08-29 MED ORDER — ONDANSETRON HCL 4 MG/2ML IJ SOLN
4.0000 mg | Freq: Once | INTRAMUSCULAR | Status: AC
  Administered 2019-08-29: 4 mg via INTRAVENOUS
  Filled 2019-08-29: qty 2

## 2019-08-29 MED ORDER — KETOROLAC TROMETHAMINE 30 MG/ML IJ SOLN
30.0000 mg | Freq: Once | INTRAMUSCULAR | Status: AC
  Administered 2019-08-29: 30 mg via INTRAVENOUS
  Filled 2019-08-29: qty 1

## 2019-08-29 MED ORDER — SODIUM CHLORIDE 0.9 % IV BOLUS
1000.0000 mL | Freq: Once | INTRAVENOUS | Status: AC
  Administered 2019-08-29: 1000 mL via INTRAVENOUS

## 2019-08-29 MED ORDER — ONDANSETRON HCL 8 MG PO TABS: 8.0000 mg | ORAL_TABLET | Freq: Three times a day (TID) | ORAL | 0 refills | Status: DC | PRN

## 2019-08-29 MED ORDER — OXYCODONE-ACETAMINOPHEN 7.5-325 MG PO TABS: 1.0000 | ORAL_TABLET | Freq: Four times a day (QID) | ORAL | 0 refills | Status: DC | PRN

## 2019-08-29 MED ORDER — HYDROMORPHONE HCL 1 MG/ML IJ SOLN
1.0000 mg | Freq: Once | INTRAMUSCULAR | Status: AC
  Administered 2019-08-29: 1 mg via INTRAVENOUS
  Filled 2019-08-29: qty 1

## 2019-08-29 NOTE — ED Triage Notes (Signed)
Left lower side pain and into privates area

## 2019-08-29 NOTE — ED Provider Notes (Signed)
[facility] Emergency Department Provider Note   ____________________________________________   First MD Initiated Contact with Patient 08/29/19 1017     (approximate)  I have reviewed the triage vital signs and the nursing notes.   HISTORY  Chief Complaint Flank Pain    HPI Jeffery Everett is a 24 y.o. male patient complain left flank, groin  and testicle pain for 3 days.  Patient denies dysuria or hematuria.  Patient denies nausea, vomiting.  No provocative measure for complaint.  No recent travel or known exposure to COVID-19.  Patient rates pain as a 10/10.  Patient described pain as "achy".  No palliative measure for complaint.         Past Medical History:  Diagnosis Date  . ADHD (attention deficit hyperactivity disorder)     Patient Active Problem List   Diagnosis Date Noted  . Attention deficit hyperactivity disorder (ADHD) 06/18/2017  . 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 Pilonidal cyst with abscess 11/25/2015    Past Surgical History:  Procedure Laterality Date  . NO PAST SURGERIES     Verified with patient (11/25/15)    Prior to Admission medications   Medication Sig Start Date End Date Taking? Authorizing Provider  amphetamine-dextroamphetamine (ADDERALL XR) 20 MG 24 hr capsule Take 1 capsule (20 mg total) by mouth daily. 07/30/19   Emi Belfast, FNP  cetirizine (ZYRTEC) 10 MG tablet Take 10 mg by mouth every morning. 10/20/15   [provider]  fluticasone (FLONASE) 50 MCG/ACT nasal spray Place 1 spray into both nostrils daily. 10/20/15   [provider]  ondansetron (ZOFRAN) 8 MG tablet Take 1 tablet (8 mg total) by mouth every 8 (eight) hours as needed for nausea or vomiting. 08/29/19   Joni Reining, PA-C  oxyCODONE-acetaminophen (PERCOCET) 7.5-325 MG tablet Take 1 tablet by mouth every 6 (six) hours as needed. 08/29/19   Joni Reining, PA-C    Allergies Patient has no known  allergies.  Family History  Problem Relation Age of Onset  . Hypertension Mother   . ADD / ADHD Father   . Hypertension Paternal Grandmother   . Bipolar disorder Paternal Grandmother   . Fibromyalgia Paternal Grandmother   . COPD Paternal Grandmother   . Heart disease Paternal Grandfather     Social History Social History   Tobacco Use  . Smoking status: Never Smoker  . Smokeless tobacco: Never Used  Substance Use Topics  . Alcohol use: No  . Drug use: Yes    Types: Morphine    Review of Systems Constitutional: No fever/chills Eyes: No visual changes. ENT: No sore throat. Cardiovascular: Denies chest pain. Respiratory: Denies shortness of breath. Gastrointestinal: No abdominal pain.  No nausea, no vomiting.  No diarrhea.  No constipation. Genitourinary: Negative for dysuria. Musculoskeletal: Negative for back pain. Skin: Negative for rash. Neurological: Negative for headaches, focal weakness or numbness. Psychiatric:  ADHD.   ____________________________________________   PHYSICAL EXAM:  VITAL SIGNS: ED Triage Vitals  Enc Vitals Group     BP 08/29/19 0937 (!) 150/105     Pulse Rate 08/29/19 0937 64     Resp 08/29/19 0937 18     Temp 08/29/19 0937 97.7 F (36.5 C)     Temp src --      SpO2 08/29/19 0937 97 %     Weight 08/29/19 0938 (!) 325 lb (147.4 kg)     Height 08/29/19 0938 6\' 2"  (1.88 m)  Head Circumference --      Peak Flow --      Pain Score 08/29/19 0938 10     Pain Loc --      Pain Edu? --      Excl. in GC? --    Constitutional: Alert and oriented.  Moderate distress.   Eyes: Conjunctivae are normal. PERRL. EOMI. Cardiovascular: Normal rate, regular rhythm. Grossly normal heart sounds.  Good peripheral circulation.  Elevated blood pressure Respiratory: Normal respiratory effort.  No retractions. Lungs CTAB. Gastrointestinal: Soft and nontender. No distention. No abdominal bruits.  Left CVA tenderness. Genitourinary:  Deferred Musculoskeletal: No lower extremity tenderness nor edema.  No joint effusions. Neurologic:  Normal speech and language. No gross focal neurologic deficits are appreciated. No gait instability. Skin:  Skin is warm, dry and intact. No rash noted. Psychiatric: Mood and affect are normal. Speech and behavior are normal.  ____________________________________________   LABS (all labs ordered are listed, but only abnormal results are displayed)  Labs Reviewed  COMPREHENSIVE METABOLIC PANEL - Abnormal; Notable for the following components:      Result Value   Glucose, Bld 116 (*)    ALT 86 (*)    All other components within normal limits  CBC  URINALYSIS, COMPLETE (UACMP) WITH MICROSCOPIC   ____________________________________________  EKG   ____________________________________________  RADIOLOGY  ED MD interpretation:    Official radiology report(s): CT Renal Stone Study  Result Date: 08/29/2019 CLINICAL DATA:  Acute flank pain. EXAM: CT ABDOMEN AND PELVIS WITHOUT CONTRAST TECHNIQUE: Multidetector CT imaging of the abdomen and pelvis was performed following the standard protocol without IV contrast. COMPARISON:  None. FINDINGS: Lower chest: No acute abnormality. Hepatobiliary: No gallstones or biliary dilatation is noted. Hepatic steatosis is noted. Pancreas: Unremarkable. No pancreatic ductal dilatation or surrounding inflammatory changes. Spleen: Normal in size without focal abnormality. Adrenals/Urinary Tract: Adrenal glands appear normal. Right kidney and ureter are unremarkable. Nonobstructive calculus seen in lower pole collecting system of left kidney. Mild left hydroureteronephrosis is noted secondary to 6 mm calculus in the distal left ureter. Urinary bladder is unremarkable. Stomach/Bowel: Stomach is within normal limits. Appendix appears normal. No evidence of bowel wall thickening, distention, or inflammatory changes. Vascular/Lymphatic: No significant vascular findings  are present. No enlarged abdominal or pelvic lymph nodes. Reproductive: Prostate is unremarkable. Other: No abdominal wall hernia or abnormality. No abdominopelvic ascites. Musculoskeletal: No acute or significant osseous findings. IMPRESSION: 1. Hepatic steatosis. 2. Mild left hydroureteronephrosis secondary to 6 mm distal left ureteral calculus. Electronically Signed   By: Lupita Raider M.D.   On: 08/29/2019 11:53    ____________________________________________   PROCEDURES  Procedure(s) performed (including Critical Care):  Procedures   ____________________________________________   INITIAL IMPRESSION / ASSESSMENT AND PLAN / ED COURSE  As part of my medical decision making, I reviewed the following data within the electronic MEDICAL RECORD NUMBER     Patient presents with left flank pain which began 2 days ago.  Patient denies dysuria or hematuria.  Patient states pain is migrated to the groin area.  CT renal study revealed 6 mm ureteral calculus with mild hydroureteronephrosis.  Patient given discharge care instruction advised take medication as directed.  Patient advised follow-up with urology if no improvement in 3 days.  Return to ED if condition worsens.   Jeffery Everett was evaluated in Emergency Department on 08/29/2019 for the symptoms described in the history of present illness. He was evaluated in the context of the global COVID-19 pandemic, which necessitated  consideration that the patient might be at risk for infection with the SARS-CoV-2 virus that causes COVID-19. Institutional protocols and algorithms that pertain to the evaluation of patients at risk for COVID-19 are in a state of rapid change based on information released by regulatory bodies including the CDC and federal and state organizations. These policies and algorithms were followed during the patient's care in the ED.       ____________________________________________   FINAL CLINICAL IMPRESSION(S) / ED  DIAGNOSES  Final diagnoses:  Ureterolithiasis     ED Discharge Orders         Ordered    oxyCODONE-acetaminophen (PERCOCET) 7.5-325 MG tablet  Every 6 hours PRN     08/29/19 1203    ondansetron (ZOFRAN) 8 MG tablet  Every 8 hours PRN     08/29/19 1203           Note:  This document was prepared using Dragon voice recognition software and may include unintentional dictation errors.    Sable Feil, PA-C 08/29/19 1210    Arta Silence, MD 08/29/19 [phone]

## 2019-08-29 NOTE — ED Triage Notes (Signed)
Pt to ER with c/o flank pain and groin pain.  Pt states groin pain started on Tuesday and the flank pain started yesterday.  Pt denies urinary difficulty or hematuria.  Pt denies n/v.

## 2019-08-29 NOTE — Discharge Instructions (Addendum)
Follow discharge care instruction take medication as directed.  Advised to strain each void and episodes to monitor passage of stone.  Advised to follow up with urology if no improvement in 3 days.

## 2019-09-03 ENCOUNTER — Encounter: Payer: Self-pay | Admitting: Urology

## 2019-09-03 ENCOUNTER — Ambulatory Visit (INDEPENDENT_AMBULATORY_CARE_PROVIDER_SITE_OTHER): Payer: BC Managed Care – PPO | Admitting: Urology

## 2019-09-03 ENCOUNTER — Other Ambulatory Visit: Payer: Self-pay | Admitting: Radiology

## 2019-09-03 ENCOUNTER — Other Ambulatory Visit: Payer: Self-pay

## 2019-09-03 ENCOUNTER — Encounter: Payer: Self-pay | Admitting: *Deleted

## 2019-09-03 VITALS — BP 132/88 | HR 90 | Ht 74.0 in | Wt 322.0 lb

## 2019-09-03 DIAGNOSIS — N200 Calculus of kidney: Secondary | ICD-10-CM | POA: Diagnosis not present

## 2019-09-03 DIAGNOSIS — N201 Calculus of ureter: Secondary | ICD-10-CM | POA: Diagnosis not present

## 2019-09-03 LAB — URINALYSIS, COMPLETE
Bilirubin, UA: NEGATIVE
Glucose, UA: NEGATIVE
Ketones, UA: NEGATIVE
Leukocytes,UA: NEGATIVE
Nitrite, UA: NEGATIVE
Specific Gravity, UA: 1.02 (ref 1.005–1.030)
Urobilinogen, Ur: 1 mg/dL (ref 0.2–1.0)
pH, UA: 7 (ref 5.0–7.5)

## 2019-09-03 LAB — MICROSCOPIC EXAMINATION: Bacteria, UA: NONE SEEN

## 2019-09-03 NOTE — Progress Notes (Signed)
 09/03/2019 4:05 PM   Jeffery Everett 11/10/1995 [id number]  Referring provider: Gessner, Deborah B, FNP [address] [place],  [place] [address]  Chief Complaint  Patient presents with  . Hydronephrosis    HPI: 23-year-old male who presents today for for further evaluation of a left distal ureteral calculus.  He was seen and evaluated the emergency room with severe acute left flank pain on 08/29/2019.  He is found to have a left 5 mm distal ureteral calculus with hydroureteronephrosis.  He also has a left lower pole nonobstructing calculus.  Ultimately, his pain was able to be controlled, no concern for infection he was discharged with outpatient urology follow-up.  He has a strong family history of kidney stones but no personal history of kidney stones.  He continues to have intermittent left flank pain.  He is having to take narcotics which makes him very sleepy and sedated and cannot work.  He denies any urinary issues including no dysuria or gross hematuria.  No fevers or chills.  He has been trying to drink lots of water and lemonade.  He has issues with anxiety and his mother is available by telephone to help him make decisions today about how to proceed.   PMH: Past Medical History:  Diagnosis Date  . ADHD (attention deficit hyperactivity disorder)     Surgical History: Past Surgical History:  Procedure Laterality Date  . NO PAST SURGERIES     Verified with patient (11/25/15)    Home Medications:  Allergies as of 09/03/2019   No Known Allergies     Medication List       Accurate as of September 03, 2019  4:05 PM. If you have any questions, ask your nurse or doctor.        amphetamine-dextroamphetamine 20 MG 24 hr capsule Commonly known as: ADDERALL XR Take 1 capsule (20 mg total) by mouth daily.   cetirizine 10 MG tablet Commonly known as: ZYRTEC Take 10 mg by mouth every morning.   fluticasone 50 MCG/ACT nasal spray Commonly known as:  FLONASE Place 1 spray into both nostrils daily.   ondansetron 8 MG tablet Commonly known as: Zofran Take 1 tablet (8 mg total) by mouth every 8 (eight) hours as needed for nausea or vomiting.   oxyCODONE-acetaminophen 7.5-325 MG tablet Commonly known as: Percocet Take 1 tablet by mouth every 6 (six) hours as needed.       Allergies: No Known Allergies  Family History: Family History  Problem Relation Age of Onset  . Hypertension Mother   . ADD / ADHD Father   . Hypertension Paternal Grandmother   . Bipolar disorder Paternal Grandmother   . Fibromyalgia Paternal Grandmother   . COPD Paternal Grandmother   . Heart disease Paternal Grandfather     Social History:  reports that he has never smoked. He has never used smokeless tobacco. He reports current drug use. Drug: Morphine. He reports that he does not drink alcohol.   Physical Exam: BP 132/88   Pulse 90   Ht 6' 2" (1.88 m)   Wt (!) 322 lb (146.1 kg)   BMI 41.34 kg/m   Constitutional:  Alert and oriented, No acute distress. HEENT: [place] AT, moist mucus membranes.  Trachea midline, no masses. Cardiovascular: No clubbing, cyanosis, or edema. Respiratory: Normal respiratory effort, no increased work of breathing. Skin: No rashes, bruises or suspicious lesions. Neurologic: Grossly intact, no focal deficits, moving all 4 extremities. Psychiatric: Normal mood and affect.  Laboratory   Data: Lab Results  Component Value Date   WBC 7.2 08/29/2019   HGB 14.3 08/29/2019   HCT 43.2 08/29/2019   MCV 91.9 08/29/2019   PLT 259 08/29/2019    Lab Results  Component Value Date   CREATININE 1.10 08/29/2019    Lab Results  Component Value Date   HGBA1C 5.5 07/30/2019    Urinalysis 3-10 RBC / HPF otherwise negative  Pertinent Imaging: Results for orders placed during the hospital encounter of 08/29/19  CT Renal Stone Study   Narrative CLINICAL DATA:  Acute flank pain.  EXAM: CT ABDOMEN AND PELVIS WITHOUT  CONTRAST  TECHNIQUE: Multidetector CT imaging of the abdomen and pelvis was performed following the standard protocol without IV contrast.  COMPARISON:  None.  FINDINGS: Lower chest: No acute abnormality.  Hepatobiliary: No gallstones or biliary dilatation is noted. Hepatic steatosis is noted.  Pancreas: Unremarkable. No pancreatic ductal dilatation or surrounding inflammatory changes.  Spleen: Normal in size without focal abnormality.  Adrenals/Urinary Tract: Adrenal glands appear normal. Right kidney and ureter are unremarkable. Nonobstructive calculus seen in lower pole collecting system of left kidney. Mild left hydroureteronephrosis is noted secondary to 6 mm calculus in the distal left ureter. Urinary bladder is unremarkable.  Stomach/Bowel: Stomach is within normal limits. Appendix appears normal. No evidence of bowel wall thickening, distention, or inflammatory changes.  Vascular/Lymphatic: No significant vascular findings are present. No enlarged abdominal or pelvic lymph nodes.  Reproductive: Prostate is unremarkable.  Other: No abdominal wall hernia or abnormality. No abdominopelvic ascites.  Musculoskeletal: No acute or significant osseous findings.  IMPRESSION: 1. Hepatic steatosis. 2. Mild left hydroureteronephrosis secondary to 6 mm distal left ureteral calculus.   Electronically Signed   By: James  Green Jr M.D.   On: 08/29/2019 11:53      Assessment & Plan:    1. Left ureteral stone Symptomatic left distal ureteral calculus with proximal hydroureteronephrosis  Patient remains symptomatic and has not passed a stone in the interim.  We discussed statistically, he is an excellent chance of passing this spontaneously with continued fluids and possibly the addition of Flomax.  Alternatively, stone treatment options including ESWL and ureteroscopy were also discussed.  Given his habitus, location of the stone and difficulty seeing on scout  imaging, he is a suboptimal candidate for ESWL.  We did discuss ureteroscopy today at length.  We can treat the obstructing stone as well as treat his nonobstructing stone in 1 sitting. Risks and benefits of ureteroscopy were reviewed including but not limited to infection, bleeding, pain, ureteral injury which could require open surgery versus prolonged indwelling if ureteral perforation occurs, persistent stone disease, requirement for staged procedure, possible stent, and global anesthesia risks. Patient expressed understanding and desires to proceed with ureteroscopy.  His mother was present today for further discussion.  Ultimately, he elected to proceed with ureteroscopy.  All questions answered.  He will need Covid testing.  No concern for infection. - Urinalysis, Complete  2. Kidney stone on left side Endoscopic management as above     Jaylan Duggar, MD  [facility] [address], [address] [place], [place] [address] [phone]  

## 2019-09-03 NOTE — H&P (View-Only) (Signed)
09/03/2019 4:05 PM   [name] 14-Jan-1996 [id number]  Referring provider: Elby Beck, [address] [place],  [place] [address]  Chief Complaint  Patient presents with  . Hydronephrosis    HPI: 24 year old male who presents today for for further evaluation of a left distal ureteral calculus.  He was seen and evaluated the emergency room with severe acute left flank pain on 08/29/2019.  He is found to have a left 5 mm distal ureteral calculus with hydroureteronephrosis.  He also has a left lower pole nonobstructing calculus.  Ultimately, his pain was able to be controlled, no concern for infection he was discharged with outpatient urology follow-up.  He has a strong family history of kidney stones but no personal history of kidney stones.  He continues to have intermittent left flank pain.  He is having to take narcotics which makes him very sleepy and sedated and cannot work.  He denies any urinary issues including no dysuria or gross hematuria.  No fevers or chills.  He has been trying to drink lots of water and lemonade.  He has issues with anxiety and his mother is available by telephone to help him make decisions today about how to proceed.   PMH: Past Medical History:  Diagnosis Date  . ADHD (attention deficit hyperactivity disorder)     Surgical History: Past Surgical History:  Procedure Laterality Date  . NO PAST SURGERIES     Verified with patient (11/25/15)    Home Medications:  Allergies as of 09/03/2019   No Known Allergies     Medication List       Accurate as of September 03, 2019  4:05 PM. If you have any questions, ask your nurse or doctor.        amphetamine-dextroamphetamine 20 MG 24 hr capsule Commonly known as: ADDERALL XR Take 1 capsule (20 mg total) by mouth daily.   cetirizine 10 MG tablet Commonly known as: ZYRTEC Take 10 mg by mouth every morning.   fluticasone 50 MCG/ACT nasal spray Commonly known as:  FLONASE Place 1 spray into both nostrils daily.   ondansetron 8 MG tablet Commonly known as: Zofran Take 1 tablet (8 mg total) by mouth every 8 (eight) hours as needed for nausea or vomiting.   oxyCODONE-acetaminophen 7.5-325 MG tablet Commonly known as: Percocet Take 1 tablet by mouth every 6 (six) hours as needed.       Allergies: No Known Allergies  Family History: Family History  Problem Relation Age of Onset  . Hypertension Mother   . ADD / ADHD Father   . Hypertension Paternal Grandmother   . Bipolar disorder Paternal Grandmother   . Fibromyalgia Paternal Grandmother   . COPD Paternal Grandmother   . Heart disease Paternal Grandfather     Social History:  reports that he has never smoked. He has never used smokeless tobacco. He reports current drug use. Drug: Morphine. He reports that he does not drink alcohol.   Physical Exam: BP 132/88   Pulse 90   Ht 6\' 2"  (1.88 m)   Wt (!) 322 lb (146.1 kg)   BMI 41.34 kg/m   Constitutional:  Alert and oriented, No acute distress. HEENT: [place] AT, moist mucus membranes.  Trachea midline, no masses. Cardiovascular: No clubbing, cyanosis, or edema. Respiratory: Normal respiratory effort, no increased work of breathing. Skin: No rashes, bruises or suspicious lesions. Neurologic: Grossly intact, no focal deficits, moving all 4 extremities. Psychiatric: Normal mood and affect.  Laboratory  Data: Lab Results  Component Value Date   WBC 7.2 08/29/2019   HGB 14.3 08/29/2019   HCT 43.2 08/29/2019   MCV 91.9 08/29/2019   PLT 259 08/29/2019    Lab Results  Component Value Date   CREATININE 1.10 08/29/2019    Lab Results  Component Value Date   HGBA1C 5.5 07/30/2019    Urinalysis 3-10 RBC / HPF otherwise negative  Pertinent Imaging: Results for orders placed during the hospital encounter of 08/29/19  CT Renal Stone Study   Narrative CLINICAL DATA:  Acute flank pain.  EXAM: CT ABDOMEN AND PELVIS WITHOUT  CONTRAST  TECHNIQUE: Multidetector CT imaging of the abdomen and pelvis was performed following the standard protocol without IV contrast.  COMPARISON:  None.  FINDINGS: Lower chest: No acute abnormality.  Hepatobiliary: No gallstones or biliary dilatation is noted. Hepatic steatosis is noted.  Pancreas: Unremarkable. No pancreatic ductal dilatation or surrounding inflammatory changes.  Spleen: Normal in size without focal abnormality.  Adrenals/Urinary Tract: Adrenal glands appear normal. Right kidney and ureter are unremarkable. Nonobstructive calculus seen in lower pole collecting system of left kidney. Mild left hydroureteronephrosis is noted secondary to 6 mm calculus in the distal left ureter. Urinary bladder is unremarkable.  Stomach/Bowel: Stomach is within normal limits. Appendix appears normal. No evidence of bowel wall thickening, distention, or inflammatory changes.  Vascular/Lymphatic: No significant vascular findings are present. No enlarged abdominal or pelvic lymph nodes.  Reproductive: Prostate is unremarkable.  Other: No abdominal wall hernia or abnormality. No abdominopelvic ascites.  Musculoskeletal: No acute or significant osseous findings.  IMPRESSION: 1. Hepatic steatosis. 2. Mild left hydroureteronephrosis secondary to 6 mm distal left ureteral calculus.   Electronically Signed   By: Lupita Raider M.D.   On: 08/29/2019 11:53      Assessment & Plan:    1. Left ureteral stone Symptomatic left distal ureteral calculus with proximal hydroureteronephrosis  Patient remains symptomatic and has not passed a stone in the interim.  We discussed statistically, he is an excellent chance of passing this spontaneously with continued fluids and possibly the addition of Flomax.  Alternatively, stone treatment options including ESWL and ureteroscopy were also discussed.  Given his habitus, location of the stone and difficulty seeing on scout  imaging, he is a suboptimal candidate for ESWL.  We did discuss ureteroscopy today at length.  We can treat the obstructing stone as well as treat his nonobstructing stone in 1 sitting. Risks and benefits of ureteroscopy were reviewed including but not limited to infection, bleeding, pain, ureteral injury which could require open surgery versus prolonged indwelling if ureteral perforation occurs, persistent stone disease, requirement for staged procedure, possible stent, and global anesthesia risks. Patient expressed understanding and desires to proceed with ureteroscopy.  His mother was present today for further discussion.  Ultimately, he elected to proceed with ureteroscopy.  All questions answered.  He will need Covid testing.  No concern for infection. - Urinalysis, Complete  2. Kidney stone on left side Endoscopic management as above     [name], MD  [facility] [address] [place], [place] [address] [phone]

## 2019-09-04 ENCOUNTER — Ambulatory Visit: Payer: BC Managed Care – PPO

## 2019-09-04 ENCOUNTER — Ambulatory Visit
Admission: RE | Admit: 2019-09-04 | Discharge: 2019-09-04 | Disposition: A | Discharge status: Home / Self Care | Payer: BC Managed Care – PPO | Attending: Urology | Admitting: Urology

## 2019-09-04 ENCOUNTER — Other Ambulatory Visit: Payer: Self-pay

## 2019-09-04 ENCOUNTER — Other Ambulatory Visit
Admission: RE | Admit: 2019-09-04 | Discharge: 2019-09-04 | Disposition: A | Discharge status: Home / Self Care | Payer: BC Managed Care – PPO | Source: Ambulatory Visit | Attending: Urology | Admitting: Urology

## 2019-09-04 ENCOUNTER — Encounter: Payer: Self-pay | Admitting: Urology

## 2019-09-04 ENCOUNTER — Encounter
Admission: RE | Disposition: A | Discharge status: Home / Self Care | Payer: Self-pay | Source: Home / Self Care | Attending: Urology

## 2019-09-04 DIAGNOSIS — Z841 Family history of disorders of kidney and ureter: Secondary | ICD-10-CM | POA: Diagnosis not present

## 2019-09-04 DIAGNOSIS — N132 Hydronephrosis with renal and ureteral calculous obstruction: Secondary | ICD-10-CM | POA: Diagnosis present

## 2019-09-04 DIAGNOSIS — Z79899 Other long term (current) drug therapy: Secondary | ICD-10-CM | POA: Diagnosis not present

## 2019-09-04 DIAGNOSIS — F419 Anxiety disorder, unspecified: Secondary | ICD-10-CM | POA: Diagnosis not present

## 2019-09-04 DIAGNOSIS — N201 Calculus of ureter: Secondary | ICD-10-CM

## 2019-09-04 DIAGNOSIS — N131 Hydronephrosis with ureteral stricture, not elsewhere classified: Secondary | ICD-10-CM | POA: Insufficient documentation

## 2019-09-04 DIAGNOSIS — Z20822 Contact with and (suspected) exposure to covid-19: Secondary | ICD-10-CM | POA: Insufficient documentation

## 2019-09-04 DIAGNOSIS — N202 Calculus of kidney with calculus of ureter: Secondary | ICD-10-CM | POA: Diagnosis not present

## 2019-09-04 DIAGNOSIS — F909 Attention-deficit hyperactivity disorder, unspecified type: Secondary | ICD-10-CM | POA: Diagnosis not present

## 2019-09-04 HISTORY — PX: CYSTOSCOPY/URETEROSCOPY/HOLMIUM LASER/STENT PLACEMENT: SHX6546

## 2019-09-04 LAB — URINE DRUG SCREEN, QUALITATIVE (ARMC ONLY)
Amphetamines, Ur Screen: NOT DETECTED
Barbiturates, Ur Screen: NOT DETECTED
Benzodiazepine, Ur Scrn: NOT DETECTED
Cannabinoid 50 Ng, Ur ~~LOC~~: NOT DETECTED
Cocaine Metabolite,Ur ~~LOC~~: NOT DETECTED
MDMA (Ecstasy)Ur Screen: NOT DETECTED
Methadone Scn, Ur: NOT DETECTED
Opiate, Ur Screen: NOT DETECTED
Phencyclidine (PCP) Ur S: NOT DETECTED
Tricyclic, Ur Screen: NOT DETECTED

## 2019-09-04 LAB — RESPIRATORY PANEL BY RT PCR (FLU A&B, COVID)
Influenza A by PCR: NEGATIVE
Influenza B by PCR: NEGATIVE
SARS Coronavirus 2 by RT PCR: NEGATIVE

## 2019-09-04 SURGERY — CYSTOSCOPY/URETEROSCOPY/HOLMIUM LASER/STENT PLACEMENT
Anesthesia: General | Laterality: Left

## 2019-09-04 MED ORDER — PROPOFOL 10 MG/ML IV BOLUS
INTRAVENOUS | Status: DC | PRN
  Administered 2019-09-04: 20 mg via INTRAVENOUS
  Administered 2019-09-04: 230 mg via INTRAVENOUS

## 2019-09-04 MED ORDER — FAMOTIDINE 20 MG PO TABS: 20.0000 mg | ORAL_TABLET | Freq: Once | ORAL | Status: AC

## 2019-09-04 MED ORDER — LIDOCAINE HCL (CARDIAC) PF 100 MG/5ML IV SOSY
PREFILLED_SYRINGE | INTRAVENOUS | Status: DC | PRN
  Administered 2019-09-04: 60 mg via INTRAVENOUS

## 2019-09-04 MED ORDER — DEXAMETHASONE SODIUM PHOSPHATE 10 MG/ML IJ SOLN
INTRAMUSCULAR | Status: DC | PRN
  Administered 2019-09-04: 5 mg via INTRAVENOUS

## 2019-09-04 MED ORDER — SUGAMMADEX SODIUM 500 MG/5ML IV SOLN
INTRAVENOUS | Status: DC | PRN
  Administered 2019-09-04: 300 mg via INTRAVENOUS

## 2019-09-04 MED ORDER — FAMOTIDINE 20 MG PO TABS
ORAL_TABLET | ORAL | Status: AC
  Administered 2019-09-04: 20 mg via ORAL
  Filled 2019-09-04: qty 1

## 2019-09-04 MED ORDER — OXYBUTYNIN CHLORIDE 5 MG PO TABS: 5.0000 mg | ORAL_TABLET | Freq: Three times a day (TID) | ORAL | 0 refills | Status: DC | PRN

## 2019-09-04 MED ORDER — OXYCODONE-ACETAMINOPHEN 7.5-325 MG PO TABS: 1.0000 | ORAL_TABLET | Freq: Four times a day (QID) | ORAL | 0 refills | Status: DC | PRN

## 2019-09-04 MED ORDER — FENTANYL CITRATE (PF) 100 MCG/2ML IJ SOLN
INTRAMUSCULAR | Status: AC
  Filled 2019-09-04: qty 2

## 2019-09-04 MED ORDER — DIAZEPAM 10 MG PO TABS: 10.0000 mg | ORAL_TABLET | Freq: Once | ORAL | 0 refills | Status: AC

## 2019-09-04 MED ORDER — PROPOFOL 10 MG/ML IV BOLUS
INTRAVENOUS | Status: AC
  Filled 2019-09-04: qty 20

## 2019-09-04 MED ORDER — FENTANYL CITRATE (PF) 100 MCG/2ML IJ SOLN
INTRAMUSCULAR | Status: DC | PRN
  Administered 2019-09-04 (×2): 25 ug via INTRAVENOUS
  Administered 2019-09-04: 100 ug via INTRAVENOUS

## 2019-09-04 MED ORDER — ROCURONIUM BROMIDE 50 MG/5ML IV SOLN
INTRAVENOUS | Status: AC
  Filled 2019-09-04: qty 1

## 2019-09-04 MED ORDER — DEXAMETHASONE SODIUM PHOSPHATE 10 MG/ML IJ SOLN
INTRAMUSCULAR | Status: AC
  Filled 2019-09-04: qty 1

## 2019-09-04 MED ORDER — DOCUSATE SODIUM 100 MG PO CAPS: 100.0000 mg | ORAL_CAPSULE | Freq: Two times a day (BID) | ORAL | 0 refills | Status: DC

## 2019-09-04 MED ORDER — SUCCINYLCHOLINE CHLORIDE 20 MG/ML IJ SOLN
INTRAMUSCULAR | Status: DC | PRN
  Administered 2019-09-04: 180 mg via INTRAVENOUS

## 2019-09-04 MED ORDER — ROCURONIUM BROMIDE 100 MG/10ML IV SOLN
INTRAVENOUS | Status: DC | PRN
  Administered 2019-09-04: 15 mg via INTRAVENOUS
  Administered 2019-09-04: 10 mg via INTRAVENOUS
  Administered 2019-09-04: 15 mg via INTRAVENOUS

## 2019-09-04 MED ORDER — TAMSULOSIN HCL 0.4 MG PO CAPS: 0.4000 mg | ORAL_CAPSULE | Freq: Every day | ORAL | 0 refills | Status: DC

## 2019-09-04 MED ORDER — LACTATED RINGERS IV SOLN: INTRAVENOUS | Status: DC

## 2019-09-04 MED ORDER — MIDAZOLAM HCL 2 MG/2ML IJ SOLN
INTRAMUSCULAR | Status: AC
  Filled 2019-09-04: qty 2

## 2019-09-04 MED ORDER — IOHEXOL 180 MG/ML  SOLN
INTRAMUSCULAR | Status: DC | PRN
  Administered 2019-09-04: 13:00:00 5844 mg

## 2019-09-04 MED ORDER — ACETAMINOPHEN 325 MG PO TABS: 325.0000 mg | ORAL_TABLET | ORAL | Status: DC | PRN

## 2019-09-04 MED ORDER — EPHEDRINE SULFATE 50 MG/ML IJ SOLN
INTRAMUSCULAR | Status: AC
  Filled 2019-09-04: qty 1

## 2019-09-04 MED ORDER — ONDANSETRON HCL 4 MG/2ML IJ SOLN
INTRAMUSCULAR | Status: DC | PRN
  Administered 2019-09-04: 4 mg via INTRAVENOUS

## 2019-09-04 MED ORDER — MIDAZOLAM HCL 2 MG/2ML IJ SOLN
INTRAMUSCULAR | Status: DC | PRN
  Administered 2019-09-04 (×2): 1 mg via INTRAVENOUS

## 2019-09-04 MED ORDER — CEFAZOLIN SODIUM 1 G IJ SOLR
INTRAMUSCULAR | Status: AC
  Filled 2019-09-04: qty 10

## 2019-09-04 MED ORDER — SUCCINYLCHOLINE CHLORIDE 20 MG/ML IJ SOLN
INTRAMUSCULAR | Status: AC
  Filled 2019-09-04: qty 1

## 2019-09-04 MED ORDER — MEPERIDINE HCL 50 MG/ML IJ SOLN: 6.2500 mg | INTRAMUSCULAR | Status: DC | PRN

## 2019-09-04 MED ORDER — HYDROCODONE-ACETAMINOPHEN 7.5-325 MG PO TABS
1.0000 | ORAL_TABLET | Freq: Once | ORAL | Status: DC | PRN
  Filled 2019-09-04: qty 1

## 2019-09-04 MED ORDER — CEFAZOLIN SODIUM-DEXTROSE 2-4 GM/100ML-% IV SOLN
2.0000 g | INTRAVENOUS | Status: AC
  Administered 2019-09-04: 3 g via INTRAVENOUS

## 2019-09-04 MED ORDER — FENTANYL CITRATE (PF) 100 MCG/2ML IJ SOLN: 25.0000 ug | INTRAMUSCULAR | Status: DC | PRN

## 2019-09-04 MED ORDER — PROMETHAZINE HCL 25 MG/ML IJ SOLN: 6.2500 mg | INTRAMUSCULAR | Status: DC | PRN

## 2019-09-04 MED ORDER — ACETAMINOPHEN 160 MG/5ML PO SOLN
325.0000 mg | ORAL | Status: DC | PRN
  Filled 2019-09-04: qty 20.3

## 2019-09-04 MED ORDER — CEFAZOLIN SODIUM-DEXTROSE 2-4 GM/100ML-% IV SOLN
INTRAVENOUS | Status: AC
  Filled 2019-09-04: qty 100

## 2019-09-04 MED ORDER — ONDANSETRON HCL 4 MG/2ML IJ SOLN
INTRAMUSCULAR | Status: AC
  Filled 2019-09-04: qty 2

## 2019-09-04 MED ORDER — LIDOCAINE HCL (PF) 2 % IJ SOLN
INTRAMUSCULAR | Status: AC
  Filled 2019-09-04: qty 5

## 2019-09-04 SURGICAL SUPPLY — 30 items
BAG DRAIN CYSTO-URO LG1000N (MISCELLANEOUS) ×3 IMPLANT
BASKET ZERO TIP 1.9FR (BASKET) IMPLANT
BRUSH SCRUB EZ 1% IODOPHOR (MISCELLANEOUS) ×3 IMPLANT
CATH URETL 5X70 OPEN END (CATHETERS) ×3 IMPLANT
CNTNR SPEC 2.5X3XGRAD LEK (MISCELLANEOUS)
CONT SPEC 4OZ STER OR WHT (MISCELLANEOUS)
CONTAINER SPEC 2.5X3XGRAD LEK (MISCELLANEOUS) IMPLANT
DRAPE UTILITY 15X26 TOWEL STRL (DRAPES) ×3 IMPLANT
FIBER LASER TRAC TIP (UROLOGICAL SUPPLIES) IMPLANT
FIBER LASER TRACTIP 200 (UROLOGICAL SUPPLIES) ×2 IMPLANT
GLOVE BIO SURGEON STRL SZ 6.5 (GLOVE) ×2 IMPLANT
GLOVE BIO SURGEONS STRL SZ 6.5 (GLOVE) ×1
GOWN STRL REUS W/ TWL LRG LVL3 (GOWN DISPOSABLE) ×2 IMPLANT
GOWN STRL REUS W/TWL LRG LVL3 (GOWN DISPOSABLE) ×4
GUIDEWIRE GREEN .038 145CM (MISCELLANEOUS) ×2 IMPLANT
GUIDEWIRE INTRO SET STRAIGHT (WIRE) ×2 IMPLANT
GUIDEWIRE STR DUAL SENSOR (WIRE) ×3 IMPLANT
INFUSOR MANOMETER BAG 3000ML (MISCELLANEOUS) ×3 IMPLANT
INTRODUCER DILATOR DOUBLE (INTRODUCER) IMPLANT
KIT TURNOVER CYSTO (KITS) ×3 IMPLANT
PACK CYSTO AR (MISCELLANEOUS) ×3 IMPLANT
SET CYSTO W/LG BORE CLAMP LF (SET/KITS/TRAYS/PACK) ×3 IMPLANT
SHEATH URETERAL 12FRX35CM (MISCELLANEOUS) IMPLANT
SOL .9 NS 3000ML IRR  AL (IV SOLUTION) ×2
SOL .9 NS 3000ML IRR UROMATIC (IV SOLUTION) ×1 IMPLANT
STENT URET 6FRX24 CONTOUR (STENTS) IMPLANT
STENT URET 6FRX26 CONTOUR (STENTS) IMPLANT
STENT URET 6FRX28 CONTOUR (STENTS) ×2 IMPLANT
SURGILUBE 2OZ TUBE FLIPTOP (MISCELLANEOUS) ×3 IMPLANT
WATER STERILE IRR 1000ML POUR (IV SOLUTION) ×3 IMPLANT

## 2019-09-04 NOTE — Discharge Instructions (Signed)
AMBULATORY SURGERY  DISCHARGE INSTRUCTIONS   1) The drugs that you were given will stay in your system until tomorrow so for the next 24 hours you should not:  A) Drive an automobile B) Make any legal decisions C) Drink any alcoholic beverage   2) You may resume regular meals tomorrow.  Today it is better to start with liquids and gradually work up to solid foods.  You may eat anything you prefer, but it is better to start with liquids, then soup and crackers, and gradually work up to solid foods.   3) Please notify your doctor immediately if you have any unusual bleeding, trouble breathing, redness and pain at the surgery site, drainage, fever, or pain not relieved by medication. 4)   5) Your post-operative visit with Dr.                                     is: Date:                        Time:    Please call to schedule your post-operative visit.  6) Additional Instructions:        You have a ureteral stent in place.  This is a tube that extends from your kidney to your bladder.  This may cause urinary bleeding, burning with urination, and urinary frequency.  Please call our office or present to the ED if you develop fevers >101 or pain which is not able to be controlled with oral pain medications.  You may be given either Flomax and/ or ditropan to help with bladder spasms and stent pain in addition to pain medications.    I did not leave your stent on a string and it will need to be removed in clinic.  Due to your history of anxiety, and like for you to consider taking a Valium 1 hour prior to the cystoscopy, stent removal in the office.  This was sent to your pharmacy.  If you choose to do this, you will need to have a driver.  [facility] [address] [place], [place] [address] [phone]

## 2019-09-04 NOTE — Op Note (Signed)
Date of procedure: 09/04/19  Preoperative diagnosis:  1. Left ureteral calculus 2. Left lower pole calculus  Postoperative diagnosis:  1. Same as above 2. Ureteral stricture, distal  Procedure: 1. Cystoscopy 2. Ureteroscopy with laser lithotripsy 3. Ureteral dilation 4. Retrograde pyelogram 5. Ureteral stent placement  Surgeon: Hollice Espy, MD  Anesthesia: General  Complications: None  Intraoperative findings: Area of focal right distal ureteral narrowing just distal to where the stone was identified on CT scan.  There was also a second area of stricture in the proximal mid ureter.  Ureteral stone treated although unable to treat nonobstructing lower pole stone.  EBL: Minimal  Specimens: None, stone dusted  Drains: 6 x 28 French double-J ureteral stone left  Indication: Jeffery Everett is a 24 y.o. patient with symptomatic obstructing left ureteral calculus electing surgical intervention.  After reviewing the management options for treatment, he elected to proceed with the above surgical procedure(s). We have discussed the potential benefits and risks of the procedure, side effects of the proposed treatment, the likelihood of the patient achieving the goals of the procedure, and any potential problems that might occur during the procedure or recuperation. Informed consent has been obtained.  Description of procedure:  The patient was taken to the operating room and general anesthesia was induced.  The patient was placed in the dorsal lithotomy position, prepped and draped in the usual sterile fashion, and preoperative antibiotics were administered. A preoperative time-out was performed.   A 21 French scope was advanced per urethra into the bladder.  Attention was turned to the left ureteral orifice.  On scout imaging, the stone in question was not easily visible as there is a lot of overlying bowel gas.  The UO was cannulated and a gentle retrograde pyelogram was performed.   This revealed a few areas of concentric narrowing, one of the distal ureter where in the mid ureter as well as a filling defect in the distal ureter at the location of the stone.  A sensor wire was then placed up to the level of the kidney.  The stone was felt with wire reverberations upon passing the wire but overall went quite easily.  This was snapped in place a safety wire.  A semirigid ureteroscope was then brought in and advanced into the distal ureter.  Unfortunately, there was a pretty tight ureteral stricture just distal to the anticipated location of the stone.  I was unable to pass the scope safely through this area.  I advanced a second Super Stiff wire under direct visualization through the strictured area up to the level of the kidney.  The lumen of the ureter at this location was barely able to accommodate both of the wires.  I attempted to use the 2 wire railroad technique to advance the semirigid through this narrowed area but unsuccessfully.  The scope was then removed.  Over the Super Stiff wire, I attempted to gently dilate the ureteral stricture using an 8/10 dilator.  I was ultimately able to get the 8 [place] dilator into the proximal ureter over the super stiff wire but never able to get the 10 [place] past this most distal stricture.  An attempt to further lubricate the ureter, did inject a slurry contrast solution through the 8 French catheter in the proximal ureter which outline the collecting system which was mildly hydronephrotic and outlined the ureter itself without extravasation, injury.  There is also an area at this point in the proximal mid ureter though she appeared quite  narrow.  I gently withdrew this 8 French catheter injecting slurry solution along the way.  I then made another attempt of passing the semirigid beyond the distal ureteral stricture this time which was possible.  Unfortunate, the stone was not encountered I was able to advance the semirigid scope up to the mid  ureter.  Presumably at push the stone back into a more proximal location with manipulation.  Next, leaving the sensor wire as a safety wire and the Super Stiff is a working wire, advanced an 8 French flexible digital ureteroscope into the mid proximal ureter to the previously noted narrow area.  Ultimately, I was not able to advance the scope any further and upon withdrawing the wire, the stone was encountered.  There was actually in fact 2 stones and it appeared slightly larger than appreciated on CT scan.  A 200 m laser fiber was then brought in and using dusting settings of 0.2 and 40 Hz, I attempted with 5S ability to fragment the stone.  Unfortunately, there is only able to partially do this and had difficulty readvancing the scope up to the stone.  Ultimately, the scope was removed and I was eventually successful in advancing the semirigid scope up to where the stone was located which was very challenging.  It was eventually able to entirely dust the stone but due to difficulty maneuvering within the ureter, was unable to obtain any specimen for stone analysis.  I was unable to advance the scope into the most proximal ureter or kidney this was not able to treat the lower pole stone today.  Finally, the scope was carefully backed out like the ureter.  There are some mucosal abrasions but no severe injuries appreciated.  The concentric areas of strictured were in fact appreciated.  No significant residual stone burden remained.  Once in the distal ureter, injected final contrast solution to ensure that there was no overt ureteral injury or extravasation none of which was appreciated.  The safety wire was backloaded over rigid cystoscope and a 6 x 28 French double-J stent was advanced over the wire to level the proximal ureter.  The wire was partially drawn till full coils noted within the renal pelvis as well as in the bladder.  I elected not to leave a string today due to the amount of manipulation and my  concern for some ureteral stricture/trauma to allow the ureter to heal.  Patient was then cleaned and dried, repositioned in supine position, reversed myesthesia, and taken to the PACU in stable condition.  Plan: Intraoperative findings were discussed with the patient's mother.  We will plan to remove his stent in the office on Tuesday.  Due to his history of severe anxiety, we will plan to give him a Valium anticipation of stent removal Tuesday.  All questions answered.  [name], M.D.

## 2019-09-04 NOTE — Anesthesia Procedure Notes (Signed)
Procedure Name: Intubation Date/Time: 09/04/2019 12:55 PM Performed by: Henrietta Hoover, CRNA Pre-anesthesia Checklist: Patient identified, Patient being monitored, Timeout performed, Emergency Drugs available and Suction available Patient Re-evaluated:Patient Re-evaluated prior to induction Oxygen Delivery Method: Circle system utilized Preoxygenation: Pre-oxygenation with 100% oxygen Induction Type: IV induction Ventilation: Mask ventilation without difficulty Laryngoscope Size: McGraph and 4 Grade View: Grade I Tube type: Oral Tube size: 7.5 mm Number of attempts: 1 Airway Equipment and Method: Stylet Placement Confirmation: ETT inserted through vocal cords under direct vision,  positive ETCO2 and breath sounds checked- equal and bilateral Secured at: 23 cm Tube secured with: Tape Dental Injury: Teeth and Oropharynx as per pre-operative assessment

## 2019-09-04 NOTE — Anesthesia Preprocedure Evaluation (Addendum)
Anesthesia Evaluation  Patient identified by MRN, date of birth, ID band Patient awake    Reviewed: Allergy & Precautions, H&P , NPO status , reviewed documented beta blocker date and time   Airway Mallampati: II  TM Distance: >3 FB Neck ROM: full    Dental  (+) Chipped, Poor Dentition, Missing Very poor dentition, none loose:   Pulmonary    Pulmonary exam normal        Cardiovascular Normal cardiovascular exam     Neuro/Psych PSYCHIATRIC DISORDERS ADD   GI/Hepatic neg GERD  ,  Endo/Other    Renal/GU      Musculoskeletal   Abdominal   Peds  Hematology   Anesthesia Other Findings Past Medical History: No date: ADHD (attention deficit hyperactivity disorder) Past Surgical History: No date: NO PAST SURGERIES     Comment:  Verified with patient (11/25/15)   Reproductive/Obstetrics                            Anesthesia Physical Anesthesia Plan  ASA: II  Anesthesia Plan: General ETT and General LMA   Post-op Pain Management:    Induction: Intravenous  PONV Risk Score and Plan: 3 and Ondansetron, Treatment may vary due to age or medical condition, Midazolam and Dexamethasone  Airway Management Planned: LMA  Additional Equipment:   Intra-op Plan:   Post-operative Plan: Extubation in OR  Informed Consent: I have reviewed the patients History and Physical, chart, labs and discussed the procedure including the risks, benefits and alternatives for the proposed anesthesia with the patient or authorized representative who has indicated his/her understanding and acceptance.     Dental Advisory Given  Plan Discussed with: CRNA  Anesthesia Plan Comments: (LMA vs ETT per surgeon requirement. UDS pending)        Anesthesia Quick Evaluation

## 2019-09-04 NOTE — Transfer of Care (Signed)
Immediate Anesthesia Transfer of Care Note  Patient: Jeffery Everett  Procedure(s) Performed: CYSTOSCOPY/URETEROSCOPY/HOLMIUM LASER/STENT PLACEMENT (Left )  Patient Location: PACU  Anesthesia Type:General  Level of Consciousness: awake and drowsy  Airway & Oxygen Therapy: Patient Spontanous Breathing and Patient connected to face mask oxygen  Post-op Assessment: Report given to RN and Post -op Vital signs reviewed and stable  Post vital signs: Reviewed and stable  Last Vitals:  Vitals Value Taken Time  BP 138/98 09/04/19 1423  Temp 36.2 C 09/04/19 1423  Pulse 80 09/04/19 1432  Resp 9 09/04/19 1432  SpO2 100 % 09/04/19 1432  Vitals shown include unvalidated device data.  Last Pain:  Vitals:   09/04/19 1423  TempSrc:   PainSc: Asleep         Complications: No apparent anesthesia complications

## 2019-09-04 NOTE — Interval H&P Note (Signed)
History and Physical Interval Note:  09/04/2019 12:00 PM  Jeffery Everett  has presented today for surgery, with the diagnosis of left nephrolithiasis.  The various methods of treatment have been discussed with the patient and family. After consideration of risks, benefits and other options for treatment, the patient has consented to  Procedure(s): CYSTOSCOPY/URETEROSCOPY/HOLMIUM LASER/STENT PLACEMENT (Left) as a surgical intervention.  The patient's history has been reviewed, patient examined, no change in status, stable for surgery.  I have reviewed the patient's chart and labs.  Questions were answered to the patient's satisfaction.    RRR CTAB   [name]

## 2019-09-09 ENCOUNTER — Other Ambulatory Visit: Payer: Self-pay

## 2019-09-09 ENCOUNTER — Encounter: Payer: Self-pay | Admitting: Urology

## 2019-09-09 ENCOUNTER — Encounter: Payer: Self-pay | Admitting: *Deleted

## 2019-09-09 ENCOUNTER — Ambulatory Visit (INDEPENDENT_AMBULATORY_CARE_PROVIDER_SITE_OTHER): Payer: BC Managed Care – PPO | Admitting: Urology

## 2019-09-09 DIAGNOSIS — N200 Calculus of kidney: Secondary | ICD-10-CM

## 2019-09-09 MED ORDER — SULFAMETHOXAZOLE-TRIMETHOPRIM 800-160 MG PO TABS: 1.0000 | ORAL_TABLET | Freq: Two times a day (BID) | ORAL | 0 refills | Status: DC

## 2019-09-09 MED ORDER — CEFTRIAXONE SODIUM 1 G IJ SOLR
1.0000 g | Freq: Once | INTRAMUSCULAR | Status: AC
  Administered 2019-09-09: 1 g via INTRAMUSCULAR

## 2019-09-09 NOTE — Progress Notes (Signed)
   09/09/19  CC:  Chief Complaint  Patient presents with  . Cysto Stent Removal    HPI: 24 year old male who presents today for cystoscopy/stent removal.  He was taken to the operating room last week for left ureteroscopy with laser lithotripsy.  We able to treat his ureteral stone but not his nonobstructing stone.  Of note, there were several areas of ureteral narrowing/stricture.  Stent has been well-tolerated.  No fevers or chills.  Urine today without white blood cells but nitrite positive (Pyridium).  Given ceftriaxone 1 g as precaution.  NED. A&Ox3.   No respiratory distress   Abd soft, NT, ND Normal phallus with bilateral descended testicles  Cystoscopy/ Stent removal procedure  Patient identification was confirmed, informed consent was obtained, and patient was prepped using Betadine solution.  Lidocaine jelly was administered per urethral meatus.    Preoperative abx where received prior to procedure.    Procedure: - Flexible cystoscope introduced, without any difficulty.   - Thorough search of the bladder revealed:    normal urethral meatus  Stent seen emanating from left ureteral orifice, grasped with stent graspers, and removed in entirety.     Moderate debris in bladder limiting visualization.  Post-Procedure: - Patient tolerated the procedure well   Post-Procedure: - Patient tolerated the procedure well  Assessment/ Plan:  1. Kidney stone on left side Status post left ureteroscopy  Stent removed today without difficulty, antibiotics given today as precaution given somewhat suspicious appearing urine although may be medication related.  Urine culture pending.  Will treat with Bactrim for 3 days as well as a precaution.  Plan for follow-up with renal ultrasound in 1 month.  Post procedural precautions reviewed.  We will discuss treatment of nonobstructing stone at follow-up visit.  - Urinalysis, Complete - CULTURE, URINE COMPREHENSIVE - US RENAL;  Future   Return in about 1 month (around 10/07/2019) for after u/s .  [name], MD

## 2019-09-09 NOTE — Addendum Note (Signed)
Addended by: Milas Kocher A on: 09/09/2019 11:59 AM   Modules accepted: Orders

## 2019-09-10 LAB — MICROSCOPIC EXAMINATION: RBC, Urine: >30 /hpf — AB (ref 0–2)

## 2019-09-10 LAB — URINALYSIS, COMPLETE
Bilirubin, UA: NEGATIVE
Glucose, UA: NEGATIVE
Nitrite, UA: POSITIVE — AB
Specific Gravity, UA: 1.025 (ref 1.005–1.030)
Urobilinogen, Ur: 2 mg/dL — ABNORMAL HIGH (ref 0.2–1.0)
pH, UA: 6 (ref 5.0–7.5)

## 2019-09-11 NOTE — Anesthesia Postprocedure Evaluation (Signed)
Anesthesia Post Note  Patient: Jeffery Everett  Procedure(s) Performed: CYSTOSCOPY/URETEROSCOPY/HOLMIUM LASER/STENT PLACEMENT (Left )  Patient location during evaluation: PACU Anesthesia Type: General Level of consciousness: awake and alert Pain management: pain level controlled Vital Signs Assessment: post-procedure vital signs reviewed and stable Respiratory status: spontaneous breathing, nonlabored ventilation and respiratory function stable Cardiovascular status: blood pressure returned to baseline and stable Postop Assessment: no apparent nausea or vomiting Anesthetic complications: no     Last Vitals:  Vitals:   09/04/19 1525 09/04/19 1541  BP: (!) 129/91 135/84  Pulse: 80 65  Resp: 12 20  Temp: (!) 36.1 C (!) 36.3 C  SpO2: 100% 100%    Last Pain:  Vitals:   09/04/19 1541  TempSrc: Tympanic  PainSc: 2                  Christia Reading

## 2019-09-13 LAB — CULTURE, URINE COMPREHENSIVE

## 2019-09-26 ENCOUNTER — Other Ambulatory Visit: Payer: Self-pay | Admitting: Urology

## 2019-10-03 ENCOUNTER — Ambulatory Visit
Admission: RE | Admit: 2019-10-03 | Discharge: 2019-10-03 | Disposition: A | Discharge status: Home / Self Care | Payer: BC Managed Care – PPO | Source: Ambulatory Visit | Attending: Urology | Admitting: Urology

## 2019-10-03 ENCOUNTER — Other Ambulatory Visit: Payer: Self-pay

## 2019-10-03 DIAGNOSIS — N200 Calculus of kidney: Secondary | ICD-10-CM | POA: Insufficient documentation

## 2019-10-06 NOTE — Progress Notes (Signed)
10/07/19 2:38 PM   [name] [birth year] [id number]  Referring provider: Elby Beck, [place],  [place] [address]  Chief Complaint  Patient presents with  . Nephrolithiasis    ultrasound results    HPI: Jeffery Everett is a 24 y.o. M who returns today for a 1 month f/u s/p ureteroscopy with laser lithotripsy.   He was seen and evaluated the emergency room with severe acute left flank pain on 08/29/2019.  He is found to have a left 6 mm distal ureteral calculus with hydroureteronephrosis.  He also has a left lower pole nonobstructing calculus.  He underwent cystoscopy/ureteroscopy/holmium laser/stent placement  on 09/04/19 for his left ureteral stone. There were several areas of ureteral narrowing/strictures noted intraop. Non-obstructing stones were not treated. Stent removed in 1 week w/o difficulty.   His RUS from 10/03/19 indicated persistent moderate left hydronephrosis similar relative to previous CT from 08/29/19 and normal right kidney.   He reports of a sharp pain relieved by sitting with back against floor.  This lasted less than 24 hours.  He is not  not experiencing pain currently.   Pt reports of no urinary symptoms today including burning and blood in urine. He is not sure of the reason for his stone formation.   He has a strong family history of kidney stones but no personal history of kidney stones.  PMH: Past Medical History:  Diagnosis Date  . ADHD (attention deficit hyperactivity disorder)     Surgical History: Past Surgical History:  Procedure Laterality Date  . CYSTOSCOPY/URETEROSCOPY/HOLMIUM LASER/STENT PLACEMENT Left 09/04/2019   Procedure: CYSTOSCOPY/URETEROSCOPY/HOLMIUM LASER/STENT PLACEMENT;  Surgeon: Hollice Espy, MD;  Location: [facility] ORS;  Service: Urology;  Laterality: Left;  . NO PAST SURGERIES     Verified with patient (11/25/15)    Home Medications:  Allergies as of 10/07/2019   No Known Allergies      Medication List       Accurate as of October 07, 2019  2:38 PM. If you have any questions, ask your nurse or doctor.        STOP taking these medications   ondansetron 8 MG tablet Commonly known as: Zofran Stopped by: Hollice Espy, MD   oxybutynin 5 MG tablet Commonly known as: DITROPAN Stopped by: Hollice Espy, MD   oxyCODONE-acetaminophen 7.5-325 MG tablet Commonly known as: Percocet Stopped by: Hollice Espy, MD   sulfamethoxazole-trimethoprim 800-160 MG tablet Commonly known as: BACTRIM DS Stopped by: Hollice Espy, MD   tamsulosin 0.4 MG Caps capsule Commonly known as: FLOMAX Stopped by: Hollice Espy, MD     TAKE these medications   amphetamine-dextroamphetamine 20 MG 24 hr capsule Commonly known as: ADDERALL XR Take 1 capsule (20 mg total) by mouth daily.   cetirizine 10 MG tablet Commonly known as: ZYRTEC Take 10 mg by mouth every morning.   docusate sodium 100 MG capsule Commonly known as: COLACE Take 1 capsule (100 mg total) by mouth 2 (two) times daily.   fluticasone 50 MCG/ACT nasal spray Commonly known as: FLONASE Place 1 spray into both nostrils daily.       Allergies: No Known Allergies  Family History: Family History  Problem Relation Age of Onset  . Hypertension Mother   . ADD / ADHD Father   . Hypertension Paternal Grandmother   . Bipolar disorder Paternal Grandmother   . Fibromyalgia Paternal Grandmother   . COPD Paternal Grandmother   . Heart disease Paternal Grandfather     Social  History:  reports that he has never smoked. He has never used smokeless tobacco. He reports current drug use. Drug: Morphine. He reports that he does not drink alcohol.   Physical Exam: BP 131/82   Pulse 73   Ht 6\' 3"  (1.905 m)   BMI 40.25 kg/m   Constitutional:  Alert and oriented, No acute distress. HEENT: [place] AT, moist mucus membranes.  Trachea midline, no masses. Cardiovascular: No clubbing, cyanosis, or edema. Respiratory: Normal  respiratory effort, no increased work of breathing. Skin: No rashes, bruises or suspicious lesions. Neurologic: Grossly intact, no focal deficits, moving all 4 extremities. Psychiatric: Normal mood and affect.  Laboratory Data: Lab Results  Component Value Date   WBC 7.2 08/29/2019   HGB 14.3 08/29/2019   HCT 43.2 08/29/2019   MCV 91.9 08/29/2019   PLT 259 08/29/2019    Lab Results  Component Value Date   CREATININE 1.10 08/29/2019     Lab Results  Component Value Date   HGBA1C 5.5 07/30/2019   Pertinent Imaging:  Results for orders placed during the hospital encounter of 10/03/19  12/03/19 RENAL   Narrative CLINICAL DATA:  Follow-up examination for a kidney stone.  EXAM: RENAL / URINARY TRACT ULTRASOUND COMPLETE  COMPARISON:  Prior CT from 08/29/2019.  FINDINGS: Right Kidney:  Renal measurements: 10.2 x 4.2 x 5.2 cm = volume: 116.0 mL . Echogenicity within normal limits. No mass or hydronephrosis visualized. No visible nephrolithiasis.  Left Kidney:  Renal measurements: 13.4 x 6.8 x 5.6 cm = volume: 264.2 mL. Echogenicity within normal limits. No discrete mass. Persistent moderate left hydronephrosis, relatively similar to previous CT. No visible nephrolithiasis by ultrasound.  Bladder:  Appears normal for degree of bladder distention. Bilateral jets are visualized.  Other:  None.  IMPRESSION: 1. Persistent moderate left hydronephrosis, similar relative to previous CT from 08/29/2019. 2. Normal right kidney.   Electronically Signed   By: 10/27/2019 M.D.   On: 10/03/2019 22:30     I have personally reviewed the images and agree with radiologist interpretation.   Assessment & Plan:    1. Left ureteral stone  S/p difficult ureteroscopy due to presence of stricture, ureteral stone dusted  2. Left moderate hydronephrosis  DDx  persistent ureteral edema vs retained stone fragements vs stricture  vs congenital RUS in 4 weeks and will  reaccess at that time  Will consider repeat CT scan vs Renal scan in future  KUB today to assess for residual fragment Asymptomatic, warning signs reviewed  3. Left kidney non obstuctive stones No intervention needed at this time however will be treated if symptomatic in future  We discussed general stone prevention techniques including drinking plenty water with goal of producing 2.5 L urine daily, increased citric acid intake, avoidance of high oxalate containing foods, and decreased salt intake.  Information about dietary recommendations given today including handout.    Return in about 4 weeks (around 11/04/2019) for RUS (kub today).  [facility] [address] [place], [place] [id number]  I, [phone], am acting as a scribe for Dr. Donne Hazel,  I have reviewed the above documentation for accuracy and completeness, and I agree with the above.   [name], MD

## 2019-10-07 ENCOUNTER — Other Ambulatory Visit: Payer: Self-pay

## 2019-10-07 ENCOUNTER — Ambulatory Visit
Admission: RE | Admit: 2019-10-07 | Discharge: 2019-10-07 | Disposition: A | Discharge status: Home / Self Care | Payer: BC Managed Care – PPO | Source: Ambulatory Visit | Attending: Urology | Admitting: Urology

## 2019-10-07 ENCOUNTER — Ambulatory Visit (INDEPENDENT_AMBULATORY_CARE_PROVIDER_SITE_OTHER): Payer: BC Managed Care – PPO | Admitting: Urology

## 2019-10-07 VITALS — BP 131/82 | HR 73 | Ht 75.0 in

## 2019-10-07 DIAGNOSIS — N200 Calculus of kidney: Secondary | ICD-10-CM

## 2019-10-07 DIAGNOSIS — N1330 Unspecified hydronephrosis: Secondary | ICD-10-CM | POA: Insufficient documentation

## 2019-10-07 DIAGNOSIS — N201 Calculus of ureter: Secondary | ICD-10-CM | POA: Diagnosis not present

## 2019-10-08 ENCOUNTER — Telehealth: Payer: Self-pay | Admitting: *Deleted

## 2019-10-08 NOTE — Telephone Encounter (Addendum)
Patient informed, verbalized understanding.   ----- Message from [name], MD sent at 10/08/2019  3:22 PM EDT ----- No stones seen.  Will f/u with renal ultrasound as discussed in 1 month.  [name], MD

## 2019-10-29 ENCOUNTER — Ambulatory Visit (INDEPENDENT_AMBULATORY_CARE_PROVIDER_SITE_OTHER): Payer: BC Managed Care – PPO | Admitting: Family Medicine

## 2019-10-29 ENCOUNTER — Encounter: Payer: Self-pay | Admitting: Family Medicine

## 2019-10-29 ENCOUNTER — Other Ambulatory Visit: Payer: Self-pay

## 2019-10-29 VITALS — BP 128/84 | HR 76 | Temp 98.0°F | Wt 314.0 lb

## 2019-10-29 DIAGNOSIS — E669 Obesity, unspecified: Secondary | ICD-10-CM | POA: Diagnosis not present

## 2019-10-29 DIAGNOSIS — F419 Anxiety disorder, unspecified: Secondary | ICD-10-CM | POA: Diagnosis not present

## 2019-10-29 DIAGNOSIS — F329 Major depressive disorder, single episode, unspecified: Secondary | ICD-10-CM

## 2019-10-29 DIAGNOSIS — F909 Attention-deficit hyperactivity disorder, unspecified type: Secondary | ICD-10-CM

## 2019-10-29 DIAGNOSIS — N201 Calculus of ureter: Secondary | ICD-10-CM | POA: Diagnosis not present

## 2019-10-29 MED ORDER — AMPHETAMINE-DEXTROAMPHET ER 20 MG PO CP24: 20.0000 mg | ORAL_CAPSULE | Freq: Every day | ORAL | 0 refills | Status: DC

## 2019-10-29 MED ORDER — AMPHETAMINE-DEXTROAMPHET ER 20 MG PO CP24: 20.0000 mg | ORAL_CAPSULE | ORAL | 0 refills | Status: DC

## 2019-10-29 NOTE — Progress Notes (Signed)
Subjective:    Patient ID: Jeffery Everett, male    DOB: [birth year], 24 y.o.   MRN: [id number]  HPI Chief Complaint  Patient presents with  . 3 Month Follow Up   This is a 24 yo male who presents today for follow up of ADHD, anxiety, depression. He was restarted on his Adderall 1/21. Reports that focus is improved. Anxiety and depression improved. Working more at [employer]. 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 Little time for fun.   No side effects with Adderall. Taking most days, not usually on days off.   Mood manageable, has times when he gets really angry. Thinking about quitting his job, has applied at [employer]. Has to "get on to" his parents about the house being dirty. Feels like he is the adult.     Review of Systems Denies chest pain, SOB, palpitations    Objective:   Physical Exam Vitals reviewed.  Constitutional:      General: He is not in acute distress.    Appearance: Normal appearance. He is obese. He is not ill-appearing, toxic-appearing or diaphoretic.  HENT:     Head: Normocephalic and atraumatic.  Eyes:     Conjunctiva/sclera: Conjunctivae normal.  Cardiovascular:     Rate and Rhythm: Normal rate.  Pulmonary:     Effort: Pulmonary effort is normal.  Neurological:     Mental Status: He is alert and oriented to person, place, and time.  Psychiatric:        Mood and Affect: Mood normal.        Behavior: Behavior normal.        Thought Content: Thought content normal.        Judgment: Judgment normal.       BP 128/84   Pulse 76   Temp 98 F (36.7 C) (Temporal)   Wt (!) 314 lb (142.4 kg)   SpO2 98%   BMI 39.25 kg/m  Wt Readings from Last 3 Encounters:  10/29/19 (!) 314 lb (142.4 kg)  09/03/19 (!) 322 lb (146.1 kg)  08/29/19 (!) 325 lb (147.4 kg)       Assessment & Plan:  1. Attention deficit hyperactivity disorder (ADHD), unspecified ADHD type - improved focus on adderall - amphetamine-dextroamphetamine (ADDERALL XR) 20 MG 24 hr  capsule; Take 1 capsule (20 mg total) by mouth daily. May fill 60 days after date on prescription.  Dispense: 30 capsule; Refill: 0 - amphetamine-dextroamphetamine (ADDERALL XR) 20 MG 24 hr capsule; Take 1 capsule (20 mg total) by mouth every morning.  Dispense: 30 capsule; Refill: 0 - amphetamine-dextroamphetamine (ADDERALL XR) 20 MG 24 hr capsule; Take 1 capsule (20 mg total) by mouth every morning.  Dispense: 30 capsule; Refill: 0  2. Obesity, Class II, BMI 35-39.9 -He has been watching his diet and has some weight loss.  Encouraged him to continue to make healthy food choices and eliminate beverages containing calories.  3. Anxiety and depression -He reports that he is in a stable place right now and does not feel like any intervention is necessary.  4. Left ureteral stone -Has follow-up scheduled with urology for repeat ultrasound and office visit later this month  -He was reminded that he can call in 3 months for refill of his Adderall.  Needs follow-up in 6 months for office visit.  This visit occurred during the SARS-CoV-2 public health emergency.  Safety protocols were in place, including screening questions prior to the visit, additional usage of staff PPE, and extensive cleaning of exam  room while observing appropriate contact time as indicated for disinfecting solutions.      Clarene Reamer, FNP-BC  [facility]  10/29/2019 11:46 AM

## 2019-10-29 NOTE — Patient Instructions (Addendum)
Good to see you today  Let me know when you need a refill on your Adderal.   Follow up in 6 months, sooner if any worsening of mood or increased anxiety

## 2019-11-07 ENCOUNTER — Other Ambulatory Visit: Payer: Self-pay

## 2019-11-07 ENCOUNTER — Ambulatory Visit
Admission: RE | Admit: 2019-11-07 | Discharge: 2019-11-07 | Disposition: A | Discharge status: Home / Self Care | Payer: BC Managed Care – PPO | Source: Ambulatory Visit | Attending: Urology | Admitting: Urology

## 2019-11-07 DIAGNOSIS — N200 Calculus of kidney: Secondary | ICD-10-CM | POA: Insufficient documentation

## 2019-11-07 DIAGNOSIS — N1330 Unspecified hydronephrosis: Secondary | ICD-10-CM | POA: Insufficient documentation

## 2019-11-10 NOTE — Progress Notes (Signed)
11/11/19 10:38 AM   Jeffery Everett 24-Feb-1996 [id number]  Referring provider: Elby Beck, [place],  [place] [address]  Chief Complaint  Patient presents with  . left ureteral stone    HPI: Jeffery Everett is a 24 y.o. M who returns today for a 4 week f/u s/p ureteroscopy with laser lithotripsy on 09/04/19.   He underwent cystoscopy/ureteroscopy/holmium laser/stent placement  on 09/04/19 for his left ureteral stone. There were several areas of ureteral narrowing/strictures noted intraop. Non-obstructing stones were not treated. Stent removed in 1 week w/o difficulty.  His RUS from 10/03/19 indicated persistent moderate left hydronephrosis similar relative to previous CT from 08/29/19 and normal right kidney.   KUB from 10/07/19 showed no stones.   F/u RUS on 11/08/19 revealed persistent moderate left hydronephrosis, unchanged from previous RUS.   He reports occational pain on in his right side, decreased from. Relieved w/ pain medication however returns thereafter. No bothersome urinary symptoms. He denies gross hematuria or dysuria.  He has a strong family history of kidney stones but no personal history of kidney stones.  PMH: Past Medical History:  Diagnosis Date  . ADHD (attention deficit hyperactivity disorder)     Surgical History: Past Surgical History:  Procedure Laterality Date  . CYSTOSCOPY/URETEROSCOPY/HOLMIUM LASER/STENT PLACEMENT Left 09/04/2019   Procedure: CYSTOSCOPY/URETEROSCOPY/HOLMIUM LASER/STENT PLACEMENT;  Surgeon: Hollice Espy, MD;  Location: [facility] ORS;  Service: Urology;  Laterality: Left;  . NO PAST SURGERIES     Verified with patient (11/25/15)    Home Medications:  Allergies as of 11/11/2019   No Known Allergies     Medication List       Accurate as of November 11, 2019 11:59 PM. If you have any questions, ask your nurse or doctor.        amphetamine-dextroamphetamine 20 MG 24 hr capsule Commonly known as:  ADDERALL XR Take 1 capsule (20 mg total) by mouth daily. May fill 60 days after date on prescription. What changed: Another medication with the same name was removed. Continue taking this medication, and follow the directions you see here. Changed by: Hollice Espy, MD   cetirizine 10 MG tablet Commonly known as: ZYRTEC Take 10 mg by mouth every morning.   docusate sodium 100 MG capsule Commonly known as: COLACE Take 1 capsule (100 mg total) by mouth 2 (two) times daily.   fluticasone 50 MCG/ACT nasal spray Commonly known as: FLONASE Place 1 spray into both nostrils daily.       Allergies: No Known Allergies  Family History: Family History  Problem Relation Age of Onset  . Hypertension Mother   . ADD / ADHD Father   . Hypertension Paternal Grandmother   . Bipolar disorder Paternal Grandmother   . Fibromyalgia Paternal Grandmother   . COPD Paternal Grandmother   . Heart disease Paternal Grandfather     Social History:  reports that he has never smoked. He has never used smokeless tobacco. He reports current drug use. Drug: Morphine. He reports that he does not drink alcohol.   Physical Exam: BP 126/72   Pulse 91   Ht 6\' 3"  (1.905 m)   Wt (!) 314 lb (142.4 kg)   BMI 39.25 kg/m   Constitutional:  Alert and oriented, No acute distress. HEENT: [place] AT, moist mucus membranes.  Trachea midline, no masses. Cardiovascular: No clubbing, cyanosis, or edema. Respiratory: Normal respiratory effort, no increased work of breathing. Skin: No rashes, bruises or suspicious lesions. Neurologic: Grossly intact, no  focal deficits, moving all 4 extremities. Psychiatric: Normal mood and affect.  Laboratory Data:  Lab Results  Component Value Date   CREATININE 1.10 08/29/2019    Lab Results  Component Value Date   HGBA1C 5.5 07/30/2019   Pertinent Imaging: Results for orders placed during the hospital encounter of 10/07/19  DG Abd 1 View   Narrative CLINICAL DATA:  Kidney  stone  EXAM: ABDOMEN - 1 VIEW  COMPARISON:  CT 08/29/2019, ultrasound 10/03/2019  FINDINGS: The bowel gas pattern is normal. No radio-opaque calculi or other significant radiographic abnormality are seen.  IMPRESSION: Negative.  No radiopaque calculi identified   Electronically Signed   By: Jasmine Pang M.D.   On: 10/07/2019 23:07    Results for orders placed during the hospital encounter of 11/07/19  US RENAL   Narrative CLINICAL DATA:  Obstructing left ureteral stone in February. Follow-up.  EXAM: RENAL / URINARY TRACT ULTRASOUND COMPLETE  COMPARISON:  10/03/2019 renal sonogram. 08/29/2019 unenhanced CT abdomen/pelvis. 10/07/2019 abdominal radiograph.  FINDINGS: Right Kidney:  Renal measurements: 11.3 x 5.2 x 4.9 cm = volume: 154 mL . Echogenicity within normal limits. No mass or hydronephrosis visualized.  Left Kidney:  Renal measurements: 12.4 x 6.2 x 6.3 cm = volume: 256 mL. Persistent moderate left hydronephrosis, not substantially changed since 10/03/2019 renal sonogram. No shadowing stones demonstrated. Echogenicity within normal limits. No mass visualized.  Bladder:  Appears normal for degree of bladder distention. Bilateral ureteral jets visualized in the bladder.  Other:  Diffusely echogenic visualized liver parenchyma compatible with diffuse hepatic steatosis as seen on recent CT.  IMPRESSION: 1. Persistent moderate left hydronephrosis, not appreciably changed since 10/03/2019 renal sonogram. Bilateral ureteral jets are seen in the bladder. Repeat CT evaluation may be considered as clinically warranted. 2. Diffuse hepatic steatosis.   Electronically Signed   By: Delbert Phenix M.D.   On: 11/08/2019 16:31     I have personally reviewed the images and agree with radiologist interpretation.   Assessment & Plan:    1. Left moderate hydronephrosis  DDx  persistent ureteral edema vs retained stone fragements vs stricture  vs congenital  CT w/o contrast to rule out stone fragments and if ruled out will consider Lasix scan.  F/u to discuss options based on findings.   2. Possible ureteral structure  See above.   Return for Will call with results.  [facility] [address], [address] [place], [place] [address] [phone]  I, Donne Hazel, am acting as a scribe for Dr. [name],  I have reviewed the above documentation for accuracy and completeness, and I agree with the above.   [name], MD

## 2019-11-11 ENCOUNTER — Ambulatory Visit (INDEPENDENT_AMBULATORY_CARE_PROVIDER_SITE_OTHER): Payer: BC Managed Care – PPO | Admitting: Urology

## 2019-11-11 ENCOUNTER — Other Ambulatory Visit: Payer: Self-pay

## 2019-11-11 ENCOUNTER — Encounter: Payer: Self-pay | Admitting: Urology

## 2019-11-11 VITALS — BP 126/72 | HR 91 | Ht 75.0 in | Wt 314.0 lb

## 2019-11-11 DIAGNOSIS — N201 Calculus of ureter: Secondary | ICD-10-CM

## 2019-11-11 DIAGNOSIS — N1330 Unspecified hydronephrosis: Secondary | ICD-10-CM

## 2019-11-11 NOTE — Patient Instructions (Signed)
Nuclear Medicine Exam A nuclear medicine exam is a safe and painless imaging test. It helps your health care provider detect and diagnose diseases. It also provides information about the ways your organs work and how they are structured. For a nuclear medicine exam, you will be given a radioactive tracer. This substance is absorbed by your body's organs. A large scanning machine detects the tracer and creates pictures of the areas that your health care provider wants to know more about. There are several kinds of nuclear medicine exams. They include the following:  CT scan.  MRI scan.  PET scan.  SPECT scan. Tell your health care provider about:  Any allergies you have.  All medicines you are taking, including vitamins, herbs, eye drops, creams, and over-the-counter medicines.  Any problems you or family members have had with anesthetic medicines.  Any blood disorders you have.  Any surgeries you have had.  Any medical conditions you have.  Whether you are pregnant or may be pregnant.  Whether you are nursing. What are the risks? Generally, this is a safe procedure. However, problems may occur, such as an allergic reaction to the tracer, but this is rare. What happens before the procedure? Medicines Ask your health care provider about:  Changing or stopping your regular medicines. This is especially important if you are taking diabetes medicines or blood thinners.  Taking medicines such as aspirin and ibuprofen. These medicines can thin your blood. Do not take these medicines unless your health care provider tells you to take them.  Taking over-the-counter medicines, vitamins, herbs, and supplements. General instructions  Follow instructions from your health care provider about eating or drinking restrictions.  Do not wear jewelry.  Wear loose, comfortable clothing. You may be asked to wear a hospital gown for the procedure.  Bring previous imaging studies, such as  X-rays, with you to the exam if they are available. What happens during the procedure?   An IV may be inserted into one of your veins.  You will be asked to lie on a table or sit in a chair.  You will be given the radioactive tracer. You may get: ? A pill or liquid to swallow. ? An injection. ? Medicine through your IV. ? A gas to inhale.  A large scanning machine will be used to create images of your body. After the pictures are taken, you may have to wait so your health care provider can make sure that enough images were taken. The procedure may vary among health care providers and hospitals. What happens after the procedure?  You may go home after the procedure and return to your usual activities, unless your health care provider tells you otherwise.  Drink enough water to keep your urine pale yellow. This helps to remove the radioactive tracer from your body.  It is up to you to get the results of your procedure. Ask your health care provider, or the department that is doing the procedure, when your results will be ready.  Get help right away if you have problems breathing. Summary  A nuclear medicine exam is a safe and painless imaging test that provides information about how your organs are working. It is also used to detect and diagnose diseases of various body organs.  Follow your health care provider's instructions about eating and drinking restrictions. Ask whether you should change or stop any medicines.  During the procedure, you will be given a radioactive tracer. A large scanning machine will create images of   your body.  You may go home after the procedure and return to your regular activities. Follow your health care provider's instructions.  Get help right away if you have problems breathing. This information is not intended to replace advice given to you by your health care provider. Make sure you discuss any questions you have with your health care  provider. Document Revised: 05/29/2018 Document Reviewed: 05/29/2018 Elsevier Patient Education  2020 [employer]  

## 2020-01-06 ENCOUNTER — Other Ambulatory Visit: Payer: Self-pay

## 2020-01-06 ENCOUNTER — Emergency Department
Admission: EM | Admit: 2020-01-06 | Discharge: 2020-01-07 | Disposition: A | Discharge status: Home / Self Care | Payer: BC Managed Care – PPO | Attending: Emergency Medicine | Admitting: Emergency Medicine

## 2020-01-06 ENCOUNTER — Emergency Department: Payer: BC Managed Care – PPO

## 2020-01-06 DIAGNOSIS — Z79899 Other long term (current) drug therapy: Secondary | ICD-10-CM | POA: Diagnosis not present

## 2020-01-06 DIAGNOSIS — Z20822 Contact with and (suspected) exposure to covid-19: Secondary | ICD-10-CM | POA: Insufficient documentation

## 2020-01-06 DIAGNOSIS — R1032 Left lower quadrant pain: Secondary | ICD-10-CM | POA: Diagnosis present

## 2020-01-06 DIAGNOSIS — N132 Hydronephrosis with renal and ureteral calculous obstruction: Secondary | ICD-10-CM | POA: Diagnosis not present

## 2020-01-06 DIAGNOSIS — N200 Calculus of kidney: Secondary | ICD-10-CM

## 2020-01-06 LAB — URINALYSIS, COMPLETE (UACMP) WITH MICROSCOPIC
Bilirubin Urine: NEGATIVE
Glucose, UA: NEGATIVE mg/dL
Ketones, ur: NEGATIVE mg/dL
Leukocytes,Ua: NEGATIVE
Nitrite: NEGATIVE
Protein, ur: 30 mg/dL — AB
RBC / HPF: >50 RBC/hpf — ABNORMAL HIGH (ref 0–5)
Specific Gravity, Urine: 1.026 (ref 1.005–1.030)
Squamous Epithelial / HPF: NONE SEEN (ref 0–5)
pH: 7 (ref 5.0–8.0)

## 2020-01-06 LAB — CBC
HCT: 41.9 % (ref 39.0–52.0)
Hemoglobin: 14.1 g/dL (ref 13.0–17.0)
MCH: 30.8 pg (ref 26.0–34.0)
MCHC: 33.7 g/dL (ref 30.0–36.0)
MCV: 91.5 fL (ref 80.0–100.0)
Platelets: 294 10*3/uL (ref 150–400)
RBC: 4.58 MIL/uL (ref 4.22–5.81)
RDW: 12.1 % (ref 11.5–15.5)
WBC: 8.1 10*3/uL (ref 4.0–10.5)
nRBC: 0 % (ref 0.0–0.2)

## 2020-01-06 LAB — BASIC METABOLIC PANEL
Anion gap: 10 (ref 5–15)
BUN: 15 mg/dL (ref 6–20)
CO2: 25 mmol/L (ref 22–32)
Calcium: 9 mg/dL (ref 8.9–10.3)
Chloride: 103 mmol/L (ref 98–111)
Creatinine, Ser: 1.23 mg/dL (ref 0.61–1.24)
GFR calc Af Amer: >60 mL/min (ref >60)
GFR calc non Af Amer: >60 mL/min (ref >60)
Glucose, Bld: 119 mg/dL — ABNORMAL HIGH (ref 70–99)
Potassium: 4.2 mmol/L (ref 3.5–5.1)
Sodium: 138 mmol/L (ref 135–145)

## 2020-01-06 MED ORDER — SULFAMETHOXAZOLE-TRIMETHOPRIM 800-160 MG PO TABS
1.0000 | ORAL_TABLET | Freq: Once | ORAL | Status: AC
  Administered 2020-01-06: 1 via ORAL
  Filled 2020-01-06: qty 1

## 2020-01-06 MED ORDER — SULFAMETHOXAZOLE-TRIMETHOPRIM 800-160 MG PO TABS
1.0000 | ORAL_TABLET | Freq: Two times a day (BID) | ORAL | 0 refills | Status: DC
Start: 2020-01-06 — End: 2020-01-23

## 2020-01-06 MED ORDER — OXYCODONE-ACETAMINOPHEN 5-325 MG PO TABS: 1.0000 | ORAL_TABLET | Freq: Three times a day (TID) | ORAL | 0 refills | Status: AC | PRN

## 2020-01-06 MED ORDER — KETOROLAC TROMETHAMINE 30 MG/ML IJ SOLN
15.0000 mg | Freq: Once | INTRAMUSCULAR | Status: AC
  Administered 2020-01-06: 15 mg via INTRAVENOUS
  Filled 2020-01-06: qty 1

## 2020-01-06 MED ORDER — ONDANSETRON 4 MG PO TBDP
4.0000 mg | ORAL_TABLET | Freq: Once | ORAL | Status: AC
  Administered 2020-01-06: 4 mg via ORAL
  Filled 2020-01-06: qty 1

## 2020-01-06 MED ORDER — OXYCODONE-ACETAMINOPHEN 5-325 MG PO TABS
1.0000 | ORAL_TABLET | Freq: Once | ORAL | Status: AC
  Administered 2020-01-06: 1 via ORAL
  Filled 2020-01-06: qty 1

## 2020-01-06 MED ORDER — TAMSULOSIN HCL 0.4 MG PO CAPS: 0.4000 mg | ORAL_CAPSULE | Freq: Every day | ORAL | 0 refills | Status: DC

## 2020-01-06 MED ORDER — LACTATED RINGERS IV BOLUS
1000.0000 mL | Freq: Once | INTRAVENOUS | Status: AC
  Administered 2020-01-06: 1000 mL via INTRAVENOUS

## 2020-01-06 MED ORDER — IBUPROFEN 600 MG PO TABS
600.0000 mg | ORAL_TABLET | Freq: Four times a day (QID) | ORAL | 0 refills | Status: DC | PRN
Start: 2020-01-06 — End: 2020-03-18

## 2020-01-06 MED ORDER — HYDROMORPHONE HCL 1 MG/ML IJ SOLN
1.0000 mg | Freq: Once | INTRAMUSCULAR | Status: AC
  Administered 2020-01-06: 1 mg via INTRAVENOUS
  Filled 2020-01-06: qty 1

## 2020-01-06 MED ORDER — TAMSULOSIN HCL 0.4 MG PO CAPS
0.4000 mg | ORAL_CAPSULE | Freq: Once | ORAL | Status: AC
  Administered 2020-01-06: 0.4 mg via ORAL
  Filled 2020-01-06: qty 1

## 2020-01-06 NOTE — ED Triage Notes (Addendum)
Pt arrives via POV for c/o left flank pain that started earlier today, denies groin or abdominal pain. Denies hematuria, dysuria. Pt appears uncomfortable during triage and c/o nausea. Pt A&Ox4. Pt reports he had a kidney stone in April on the left side

## 2020-01-06 NOTE — ED Provider Notes (Signed)
[facility] Emergency Department Provider Note  ____________________________________________  Time seen: Approximately 7:08 PM  I have reviewed the triage vital signs and the nursing notes.   HISTORY  Chief Complaint Flank Pain    HPI Jeffery Everett is a 24 y.o. male that presents to the emergency department for evaluation of left mid back pain today.  Patient had an episode of vomiting today due to pain.  Patient had a left-sided kidney stone in February that required lithotripsy with Dr. Apolinar Junes.  No fevers.  No abdominal pain, diarrhea.  Past Medical History:  Diagnosis Date  . ADHD (attention deficit hyperactivity disorder)     Patient Active Problem List   Diagnosis Date Noted  . Left ureteral stone 10/29/2019  . Attention deficit hyperactivity disorder (ADHD) 06/18/2017  . 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 Pilonidal cyst with abscess 11/25/2015    Past Surgical History:  Procedure Laterality Date  . CYSTOSCOPY/URETEROSCOPY/HOLMIUM LASER/STENT PLACEMENT Left 09/04/2019   Procedure: CYSTOSCOPY/URETEROSCOPY/HOLMIUM LASER/STENT PLACEMENT;  Surgeon: [name], MD;  Location: [facility] ORS;  Service: Urology;  Laterality: Left;  . NO PAST SURGERIES     Verified with patient (11/25/15)    Prior to Admission medications   Medication Sig Start Date End Date Taking? Authorizing Provider  amphetamine-dextroamphetamine (ADDERALL XR) 20 MG 24 hr capsule Take 1 capsule (20 mg total) by mouth daily. May fill 60 days after date on prescription. 10/29/19   Emi Belfast, FNP  cetirizine (ZYRTEC) 10 MG tablet Take 10 mg by mouth every morning. 10/20/15   [provider]  docusate sodium (COLACE) 100 MG capsule Take 1 capsule (100 mg total) by mouth 2 (two) times daily. 09/04/19   [name], MD  fluticasone (FLONASE) 50 MCG/ACT nasal spray Place 1 spray into both nostrils daily. 10/20/15   [provider]  ibuprofen (ADVIL)  600 MG tablet Take 1 tablet (600 mg total) by mouth every 6 (six) hours as needed. 01/06/20   Enid Derry, PA-C  oxyCODONE-acetaminophen (PERCOCET) 5-325 MG tablet Take 1 tablet by mouth every 8 (eight) hours as needed for up to 2 days for severe pain. 01/06/20 01/08/20  Enid Derry, PA-C  sulfamethoxazole-trimethoprim (BACTRIM DS) 800-160 MG tablet Take 1 tablet by mouth 2 (two) times daily. 01/06/20   Enid Derry, PA-C  tamsulosin (FLOMAX) 0.4 MG CAPS capsule Take 1 capsule (0.4 mg total) by mouth daily. 01/06/20   Enid Derry, PA-C    Allergies Patient has no known allergies.  Family History  Problem Relation Age of Onset  . Hypertension Mother   . ADD / ADHD Father   . Hypertension Paternal Grandmother   . Bipolar disorder Paternal Grandmother   . Fibromyalgia Paternal Grandmother   . COPD Paternal Grandmother   . Heart disease Paternal Grandfather     Social History Social History   Tobacco Use  . Smoking status: Never Smoker  . Smokeless tobacco: Never Used  Substance Use Topics  . Alcohol use: No  . Drug use: Yes    Types: Morphine     Review of Systems  Constitutional: No fever/chills Respiratory:  No SOB. Gastrointestinal: No abdominal pain.  No nausea, no vomiting.  Genitourinary: Negative for dysuria. Musculoskeletal: Positive for left mid back pain. Skin: Negative for rash, abrasions, lacerations, ecchymosis. Neurological: Negative for headaches   ____________________________________________   PHYSICAL EXAM:  VITAL SIGNS: ED Triage Vitals [01/06/20 1730]  Enc Vitals Group     BP (!) 142/96  Pulse Rate 70     Resp 18     Temp 97.9 F (36.6 C)     Temp Source Oral     SpO2 98 %     Weight 300 lb (136.1 kg)     Height 6\' 3"  (1.905 m)     Head Circumference      Peak Flow      Pain Score 10     Pain Loc      Pain Edu?      Excl. in GC?      Constitutional: Alert and oriented. Well appearing and in no acute distress. Eyes:  Conjunctivae are normal. PERRL. EOMI. Head: Atraumatic. ENT:      Ears:      Nose: No congestion/rhinnorhea.      Mouth/Throat: Mucous membranes are moist.  Neck: No stridor. Cardiovascular: Normal rate, regular rhythm.  Good peripheral circulation. Respiratory: Normal respiratory effort without tachypnea or retractions. Lungs CTAB. Good air entry to the bases with no decreased or absent breath sounds. Gastrointestinal: Bowel sounds 4 quadrants. Soft and nontender to palpation. No guarding or rigidity. No palpable masses. No distention.  Musculoskeletal: Full range of motion to all extremities. No gross deformities appreciated. Neurologic:  Normal speech and language. No gross focal neurologic deficits are appreciated.  Skin:  Skin is warm, dry and intact. No rash noted. Psychiatric: Mood and affect are normal. Speech and behavior are normal. Patient exhibits appropriate insight and judgement.   ____________________________________________   LABS (all labs ordered are listed, but only abnormal results are displayed)  Labs Reviewed  URINALYSIS, COMPLETE (UACMP) WITH MICROSCOPIC - Abnormal; Notable for the following components:      Result Value   Color, Urine YELLOW (*)    APPearance HAZY (*)    Hgb urine dipstick SMALL (*)    Protein, ur 30 (*)    RBC / HPF >50 (*)    Bacteria, UA RARE (*)    All other components within normal limits  BASIC METABOLIC PANEL - Abnormal; Notable for the following components:   Glucose, Bld 119 (*)    All other components within normal limits  URINE CULTURE  CBC   ____________________________________________  EKG   ____________________________________________  RADIOLOGY , personally viewed and evaluated these images (plain radiographs) as part of my medical decision making, as well as reviewing the written report by the radiologist.  CT Renal Stone Study  Result Date: 01/06/2020 CLINICAL DATA:  Left flank pain EXAM: CT  ABDOMEN AND PELVIS WITHOUT CONTRAST TECHNIQUE: Multidetector CT imaging of the abdomen and pelvis was performed following the standard protocol without IV contrast. COMPARISON:  None. FINDINGS: Lower chest: Lung bases are clear. No effusions. Heart is normal size. Hepatobiliary: Diffuse low-density throughout the liver compatible with fatty infiltration. No focal abnormality. Gallbladder unremarkable. Pancreas: No focal abnormality or ductal dilatation. Spleen: No focal abnormality.  Normal size. Adrenals/Urinary Tract: Left hydronephrosis and hydroureter due to 5 mm mid left ureteral stone at the pelvic brim. No additional ureteral stones. No hydronephrosis on the right. Adrenal glands and urinary bladder unremarkable. Stomach/Bowel: Stomach, large and small bowel grossly unremarkable. Appendix normal. Vascular/Lymphatic: No evidence of aneurysm or adenopathy. Reproductive: No visible focal abnormality. Other: No free fluid or free air. Musculoskeletal: No acute bony abnormality. IMPRESSION: 5 mm mid left ureteral stone with left hydronephrosis. Fatty infiltration of the liver. Electronically Signed   By: 01/08/2020 M.D.   On: 01/06/2020 19:33    ____________________________________________  PROCEDURES  Procedure(s) performed:    Procedures    Medications  ondansetron (ZOFRAN-ODT) disintegrating tablet 4 mg (4 mg Oral Given 01/06/20 2015)  oxyCODONE-acetaminophen (PERCOCET/ROXICET) 5-325 MG per tablet 1 tablet (1 tablet Oral Given 01/06/20 2016)  tamsulosin (FLOMAX) capsule 0.4 mg (0.4 mg Oral Given 01/06/20 2303)  lactated ringers bolus 1,000 mL (1,000 mLs Intravenous New Bag/Given 01/06/20 2222)  HYDROmorphone (DILAUDID) injection 1 mg (1 mg Intravenous Given 01/06/20 2219)  ketorolac (TORADOL) 30 MG/ML injection 15 mg (15 mg Intravenous Given 01/06/20 2218)  sulfamethoxazole-trimethoprim (BACTRIM DS) 800-160 MG per tablet 1 tablet (1 tablet Oral Given 01/06/20 2303)      ____________________________________________   INITIAL IMPRESSION / ASSESSMENT AND PLAN / ED COURSE  Pertinent labs & imaging results that were available during my care of the patient were reviewed by me and considered in my medical decision making (see chart for details).  Review of the [place] CSRS was performed in accordance of the [facility] prior to dispensing any controlled drugs.   Patient presented to emergency department for evaluation of left mid back pain today.  Vital signs and exam are reassuring.  Patient was initially given a dose of Percocet for pain.  CT scan shows a 5 mm stone with left-sided hydronephrosis.  Dr. Erma Heritage has viewed CT images and has been in to evaluate the patient.  He ordered additional Dilaudid and Toradol for patient's pain with plan to discharge patient following pain control after reassuring urinalysis or admit as needed for pain control.  Patient states that he has absolutely no pain after medications.  Urinalysis shows some rare bacteria but no white blood cells or leukocytes.  Urine was sent for culture.  He was given a dose of Bactrim to cover for unlikely but possible infection.  He has no leukocytosis or fever.  Admission was offered to patient and parent but they strongly prefer to go home and are requesting discharge.  They will call Dr. Apolinar Junes in the morning with the plan to follow-up with her tomorrow or Thursday.  Patient will be discharged home with prescriptions for Bactrim, Percocet, Flomax, Advil. Patient is to follow up with urology as directed. Patient is given ED precautions to return to the ED for any worsening or new symptoms.  Chibuikem Thang was evaluated in Emergency Department on 01/06/2020 for the symptoms described in the history of present illness. He was evaluated in the context of the global COVID-19 pandemic, which necessitated consideration that the patient might be at risk for infection with the SARS-CoV-2 virus that causes COVID-19.  Institutional protocols and algorithms that pertain to the evaluation of patients at risk for COVID-19 are in a state of rapid change based on information released by regulatory bodies including the CDC and federal and state organizations. These policies and algorithms were followed during the patient's care in the ED.   ____________________________________________  FINAL CLINICAL IMPRESSION(S) / ED DIAGNOSES  Final diagnoses:  Nephrolithiasis      NEW MEDICATIONS STARTED DURING THIS VISIT:  ED Discharge Orders         Ordered    sulfamethoxazole-trimethoprim (BACTRIM DS) 800-160 MG tablet  2 times daily     Discontinue  Reprint     01/06/20 2245    oxyCODONE-acetaminophen (PERCOCET) 5-325 MG tablet  Every 8 hours PRN     Discontinue  Reprint     01/06/20 2245    ibuprofen (ADVIL) 600 MG tablet  Every 6 hours PRN     Discontinue  Reprint  01/06/20 2245    tamsulosin (FLOMAX) 0.4 MG CAPS capsule  Daily     Discontinue  Reprint     01/06/20 2245              This chart was dictated using voice recognition software/Dragon. Despite best efforts to proofread, errors can occur which can change the meaning. Any change was purely unintentional.    Laban Emperor, PA-C 01/07/20 0000    Duffy Bruce, MD 01/08/20 [phone]

## 2020-01-06 NOTE — ED Notes (Signed)
Pt given a cup for urine. Pt had urinated in an emesis bag. PA notified. Pt sleeping when this RN entered. Pt given nausea and pain meds. Family at bedside.

## 2020-01-07 ENCOUNTER — Encounter: Payer: Self-pay | Admitting: Urology

## 2020-01-07 ENCOUNTER — Other Ambulatory Visit
Admission: RE | Admit: 2020-01-07 | Discharge: 2020-01-07 | Disposition: A | Discharge status: Home / Self Care | Payer: BC Managed Care – PPO | Source: Ambulatory Visit | Attending: Urology | Admitting: Urology

## 2020-01-07 ENCOUNTER — Ambulatory Visit
Admission: RE | Admit: 2020-01-07 | Discharge: 2020-01-07 | Disposition: A | Discharge status: Home / Self Care | Payer: BC Managed Care – PPO | Source: Home / Self Care | Attending: Urology | Admitting: Urology

## 2020-01-07 ENCOUNTER — Telehealth: Payer: Self-pay | Admitting: Urology

## 2020-01-07 ENCOUNTER — Ambulatory Visit
Admission: RE | Admit: 2020-01-07 | Discharge: 2020-01-07 | Disposition: A | Discharge status: Home / Self Care | Payer: BC Managed Care – PPO | Source: Ambulatory Visit | Attending: Urology | Admitting: Urology

## 2020-01-07 ENCOUNTER — Other Ambulatory Visit: Payer: Self-pay | Admitting: Radiology

## 2020-01-07 ENCOUNTER — Other Ambulatory Visit: Payer: Self-pay | Admitting: *Deleted

## 2020-01-07 ENCOUNTER — Ambulatory Visit (INDEPENDENT_AMBULATORY_CARE_PROVIDER_SITE_OTHER): Payer: BC Managed Care – PPO | Admitting: Urology

## 2020-01-07 ENCOUNTER — Other Ambulatory Visit: Payer: Self-pay

## 2020-01-07 VITALS — BP 122/79 | HR 92 | Ht 74.0 in | Wt 320.0 lb

## 2020-01-07 DIAGNOSIS — R3129 Other microscopic hematuria: Secondary | ICD-10-CM | POA: Diagnosis not present

## 2020-01-07 DIAGNOSIS — N1330 Unspecified hydronephrosis: Secondary | ICD-10-CM

## 2020-01-07 DIAGNOSIS — N201 Calculus of ureter: Secondary | ICD-10-CM

## 2020-01-07 NOTE — Progress Notes (Signed)
01/07/2020 4:29 PM   [name] [birth year] [id number]  Referring provider: Elby Beck, [place],  [place] [address]  Chief Complaint  Patient presents with  . Nephrolithiasis    HPI: Jeffery Everett is a 24 y.o. male with nephrolithiasis, left hydronephrosis and a possible left ureteral stricture who presents today for a follow up ED visit.  Nephrolithiasis CT renal stone study 08/29/2019 Mild left hydroureteronephrosis secondary to 6 mm distal left ureteral calculus.  He underwent left URS with Dr. Erlene Quan on September 04, 2019.  He then returned on September 09, 2019 for cystoscopy with stent removal.  He was found to have persistent hydronephrosis on follow-up imaging, please see discussion under left hydronephrosis and a possible left ureteral stricture.  RUS 10/03/2019 Persistent moderate left hydronephrosis, similar relative to previous CT from 08/29/2019.  Normal right kidney.  KUB 10/07/2019 Negative.  No radiopaque calculi identified  RUS 11/07/2019 Persistent moderate left hydronephrosis, not appreciably changed since 10/03/2019 renal sonogram. Bilateral ureteral jets are seen in the bladder. Repeat CT evaluation may be considered as clinically warranted.  Diffuse hepatic steatosis.  CT Renal stone study 01/06/2020 5 mm mid left ureteral stone with left hydronephrosis.  Fatty infiltration of the liver.  Yesterday, he sought treatment in the emergency room for mid left back pain associated with vomiting.  CT scan noted a 5 mm mid left ureteral stone with left hydronephrosis.  Labs in the ED: Serum creatinine was 1.23 which is his baseline, WBC count was 8.1 and UA positive for greater than 50 RBCs with urine culture pending   Meds given in the ED: Bactrim, Percocet, Advil and Flomax  Current NSAID/anticoagulation:   Advil   Today, he is not experiencing discomfort.  He is has not passed a definite fragment.  1/10 pain today.   Patient denies any modifying or aggravating factors.  Patient denies any gross hematuria, dysuria or suprapubic/flank pain.  Patient denies any fevers, chills, nausea or vomiting.   KUB today no calculi identified.  UA is negative for micro heme.  Hydronephrosis with possible left ureteral stricture An area of focal left distal ureteral narrowing was appreciated during ureteroscopy performed for definitive stone management in February 2021.  Persistent left hydronephrosis was noted on follow-up renal ultrasounds x2.  It was recommended to the patient that he undergo a CT scan to evaluate for any persistent ureteral calculi and then there was consideration for a renal Lasix study if CT scan did not identify any ureteral calculi.  Patient did not follow-up with this treatment plan as recommended.  PMH: Past Medical History:  Diagnosis Date  . ADHD (attention deficit hyperactivity disorder)     Surgical History: Past Surgical History:  Procedure Laterality Date  . CYSTOSCOPY/URETEROSCOPY/HOLMIUM LASER/STENT PLACEMENT Left 09/04/2019   Procedure: CYSTOSCOPY/URETEROSCOPY/HOLMIUM LASER/STENT PLACEMENT;  Surgeon: Hollice Espy, MD;  Location: [facility] ORS;  Service: Urology;  Laterality: Left;  . NO PAST SURGERIES     Verified with patient (11/25/15)    Home Medications:  Allergies as of 01/07/2020   No Known Allergies     Medication List       Accurate as of January 07, 2020  4:29 PM. If you have any questions, ask your nurse or doctor.        amphetamine-dextroamphetamine 20 MG 24 hr capsule Commonly known as: ADDERALL XR Take 1 capsule (20 mg total) by mouth daily. May fill 60 days after date on prescription.   cetirizine 10 MG  tablet Commonly known as: ZYRTEC Take 10 mg by mouth every morning.   docusate sodium 100 MG capsule Commonly known as: COLACE Take 1 capsule (100 mg total) by mouth 2 (two) times daily.   fluticasone 50 MCG/ACT nasal spray Commonly known as: FLONASE Place 1  spray into both nostrils daily.   ibuprofen 600 MG tablet Commonly known as: ADVIL Take 1 tablet (600 mg total) by mouth every 6 (six) hours as needed.   oxyCODONE-acetaminophen 5-325 MG tablet Commonly known as: Percocet Take 1 tablet by mouth every 8 (eight) hours as needed for up to 2 days for severe pain.   sulfamethoxazole-trimethoprim 800-160 MG tablet Commonly known as: BACTRIM DS Take 1 tablet by mouth 2 (two) times daily.   tamsulosin 0.4 MG Caps capsule Commonly known as: Flomax Take 1 capsule (0.4 mg total) by mouth daily.       Allergies: No Known Allergies  Family History: Family History  Problem Relation Age of Onset  . Hypertension Mother   . ADD / ADHD Father   . Hypertension Paternal Grandmother   . Bipolar disorder Paternal Grandmother   . Fibromyalgia Paternal Grandmother   . COPD Paternal Grandmother   . Heart disease Paternal Grandfather     Social History:  reports that he has never smoked. He has never used smokeless tobacco. He reports current drug use. Drug: Morphine. He reports that he does not drink alcohol.  ROS: Pertinent ROS in HPI  Physical Exam: BP 122/79   Pulse 92   Ht '6\' 2"'  (1.88 m)   Wt (!) 320 lb (145.2 kg)   BMI 41.09 kg/m   Constitutional:  Well nourished. Alert and oriented, No acute distress. HEENT: [place] AT, mask in place.  Trachea midline Cardiovascular: No clubbing, cyanosis, or edema. Respiratory: Normal respiratory effort, no increased work of breathing. Neurologic: Grossly intact, no focal deficits, moving all 4 extremities. Psychiatric: Normal mood and affect.  Laboratory Data: Lab Results  Component Value Date   WBC 8.1 01/06/2020   HGB 14.1 01/06/2020   HCT 41.9 01/06/2020   MCV 91.5 01/06/2020   PLT 294 01/06/2020    Lab Results  Component Value Date   CREATININE 1.23 01/06/2020     Lab Results  Component Value Date   HGBA1C 5.5 07/30/2019    Lab Results  Component Value Date   TSH 2.42  01/25/2018       Component Value Date/Time   CHOL 190 07/30/2019 1140   HDL 62.50 07/30/2019 1140   CHOLHDL 3 07/30/2019 1140   VLDL 16.4 07/30/2019 1140   LDLCALC 111 (H) 07/30/2019 1140    Lab Results  Component Value Date   AST 39 08/29/2019   Lab Results  Component Value Date   ALT 86 (H) 08/29/2019    Urinalysis Component     Latest Ref Rng & Units 01/07/2020  Specific Gravity, UA     1.005 - 1.030 >1.030 (H)  pH, UA     5.0 - 7.5 6.0  Color, UA     Yellow Yellow  Appearance Ur     Clear Hazy (A)  Leukocytes,UA     Negative Negative  Protein,UA     Negative/Trace Negative  Glucose, UA     Negative Negative  Ketones, UA     Negative Negative  RBC, UA     Negative Trace (A)  Bilirubin, UA     Negative Negative  Urobilinogen, Ur     0.2 - 1.0 mg/dL 0.2  Nitrite, UA     Negative Negative  Microscopic Examination      See below:   Component     Latest Ref Rng & Units 01/07/2020  WBC, UA     0 - 5 /hpf 6-10 (A)  RBC     0 - 2 /hpf 0-2  Epithelial Cells (non renal)     0 - 10 /hpf 0-10  Bacteria, UA     None seen/Few None seen   I have reviewed the labs.   Pertinent Imaging: CLINICAL DATA:  Left flank pain  EXAM: CT ABDOMEN AND PELVIS WITHOUT CONTRAST  TECHNIQUE: Multidetector CT imaging of the abdomen and pelvis was performed following the standard protocol without IV contrast.  COMPARISON:  None.  FINDINGS: Lower chest: Lung bases are clear. No effusions. Heart is normal size.  Hepatobiliary: Diffuse low-density throughout the liver compatible with fatty infiltration. No focal abnormality. Gallbladder unremarkable.  Pancreas: No focal abnormality or ductal dilatation.  Spleen: No focal abnormality.  Normal size.  Adrenals/Urinary Tract: Left hydronephrosis and hydroureter due to 5 mm mid left ureteral stone at the pelvic brim. No additional ureteral stones. No hydronephrosis on the right. Adrenal glands and urinary  bladder unremarkable.  Stomach/Bowel: Stomach, large and small bowel grossly unremarkable. Appendix normal.  Vascular/Lymphatic: No evidence of aneurysm or adenopathy.  Reproductive: No visible focal abnormality.  Other: No free fluid or free air.  Musculoskeletal: No acute bony abnormality.  IMPRESSION: 5 mm mid left ureteral stone with left hydronephrosis.  Fatty infiltration of the liver.   Electronically Signed   By: Rolm Baptise M.D.   On: 01/06/2020 19:33 I have independently reviewed the films.    Assessment & Plan:    1. Left ureteral stone SD 333 HU   Skin to stone distance 13 cm  Stone not visible on KUB Explained to the patient that since their stone is  ?10 mm, it is in within AUA Guidelines to continue MET with tamsulosin and pushing fluids for 4 to 6 weeks -patient is not wanting to pursue MET therapy at this time.  He tolerated his stent poorly with his previous ureteroscopy.  His case is discussed with Dr. Erlene Quan and Dr. Diamantina Providence and will attempt ESWL at this time.  Contrast will be used to identify the location of his stone.  Explained to the patient that with ESWL, shock waves are focused on the stone using X-rays to pinpoint the stone.  The shock waves are fired repeatedly which usually causes the stone to break into small pieces which pass out in the urine over the next few weeks.  They will go home that day and may be able to resume normal activities in three days.  They should be given a strainer and they need to collect any fragments/sediments that they pass for analysis.   Risks involved with the procedure consist of bruising of the skin and kidney, possible long-term kidney damage, development of HTN, damage to the bowel, spleen, liver, pancreas, male organs or lungs, hematuria, hematuria serious enough to require transfusion or surgical repair, formation of a hematoma, infection or sepsis.  If the stone is too large or dense, it may not break apart or  break apart in large pieces and cause "Steinstrasse."  If this happens, it would result in the need for another procedure (likely URS/LL/stent placement).  IV sedation is typically used, but in rare instances general anesthesia is used with the risk of irregular heart beat, irregular BP, stroke, MI,  CVA, paralysis, coma and/or death.  2. Left hydronephrosis Will obtain RUS to ensure the hydronephrosis has resolved once they have passed and/or recovered from procedure as he has a possible history of left ureteral stricture If hydronephrosis persists, see discussion under possible left ureteral stricture  3. Microsopic hematuria UA from the ED visit yesterday was positive for greater than 50 RBCs Continue to monitor the patient's UA after the treatment/passage of the stone to ensure the hematuria has resolved If hematuria persists, we will pursue a hematuria workup with CT Urogram and cystoscopy if appropriate.  4. 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   Return for Left ESWL.  These notes generated with voice recognition software. I apologize for typographical errors.  Zara Council, PA-C  [place] [address]  [place], [place] [address] [phone]  I spent 30 minutes on the day of the encounter to include pre-visit record review, face-to-face time with the patient, and post-visit ordering of tests.

## 2020-01-08 ENCOUNTER — Encounter: Payer: Self-pay | Admitting: Urology

## 2020-01-08 ENCOUNTER — Other Ambulatory Visit: Payer: Self-pay

## 2020-01-08 ENCOUNTER — Encounter
Admission: RE | Disposition: A | Discharge status: Home / Self Care | Payer: Self-pay | Source: Ambulatory Visit | Attending: Urology

## 2020-01-08 ENCOUNTER — Ambulatory Visit
Admission: RE | Admit: 2020-01-08 | Discharge: 2020-01-08 | Disposition: A | Discharge status: Home / Self Care | Payer: BC Managed Care – PPO | Source: Ambulatory Visit | Attending: Urology | Admitting: Urology

## 2020-01-08 DIAGNOSIS — N201 Calculus of ureter: Secondary | ICD-10-CM | POA: Insufficient documentation

## 2020-01-08 HISTORY — PX: EXTRACORPOREAL SHOCK WAVE LITHOTRIPSY: SHX1557

## 2020-01-08 LAB — URINE CULTURE: Culture: NO GROWTH

## 2020-01-08 LAB — SARS CORONAVIRUS 2 (TAT 6-24 HRS): SARS Coronavirus 2: NEGATIVE

## 2020-01-08 SURGERY — LITHOTRIPSY, ESWL
Anesthesia: Moderate Sedation | Laterality: Left

## 2020-01-08 MED ORDER — SODIUM CHLORIDE 0.9 % IV SOLN: INTRAVENOUS | Status: DC

## 2020-01-08 MED ORDER — OXYCODONE-ACETAMINOPHEN 5-325 MG PO TABS: 1.0000 | ORAL_TABLET | ORAL | 0 refills | Status: AC | PRN

## 2020-01-08 MED ORDER — DIAZEPAM 5 MG PO TABS: 10.0000 mg | ORAL_TABLET | ORAL | Status: AC

## 2020-01-08 MED ORDER — ONDANSETRON HCL 4 MG/2ML IJ SOLN: 4.0000 mg | Freq: Once | INTRAMUSCULAR | Status: AC | PRN

## 2020-01-08 MED ORDER — DIAZEPAM 5 MG PO TABS
ORAL_TABLET | ORAL | Status: AC
  Administered 2020-01-08: 10 mg via ORAL
  Filled 2020-01-08: qty 2

## 2020-01-08 MED ORDER — DIPHENHYDRAMINE HCL 25 MG PO CAPS
ORAL_CAPSULE | ORAL | Status: AC
  Administered 2020-01-08: 25 mg via ORAL
  Filled 2020-01-08: qty 1

## 2020-01-08 MED ORDER — CIPROFLOXACIN HCL 500 MG PO TABS
ORAL_TABLET | ORAL | Status: AC
  Administered 2020-01-08: 500 mg via ORAL
  Filled 2020-01-08: qty 1

## 2020-01-08 MED ORDER — DIPHENHYDRAMINE HCL 25 MG PO CAPS: 25.0000 mg | ORAL_CAPSULE | ORAL | Status: AC

## 2020-01-08 MED ORDER — ONDANSETRON HCL 4 MG/2ML IJ SOLN
INTRAMUSCULAR | Status: AC
  Administered 2020-01-08: 4 mg via INTRAVENOUS
  Filled 2020-01-08: qty 2

## 2020-01-08 MED ORDER — CIPROFLOXACIN HCL 500 MG PO TABS: 500.0000 mg | ORAL_TABLET | ORAL | Status: AC

## 2020-01-08 NOTE — Discharge Instructions (Signed)
AMBULATORY SURGERY  DISCHARGE INSTRUCTIONS   1) The drugs that you were given will stay in your system until tomorrow so for the next 24 hours you should not:  A) Drive an automobile B) Make any legal decisions C) Drink any alcoholic beverage   2) You may resume regular meals tomorrow.  Today it is better to start with liquids and gradually work up to solid foods.  You may eat anything you prefer, but it is better to start with liquids, then soup and crackers, and gradually work up to solid foods.   3) Please notify your doctor immediately if you have any unusual bleeding, trouble breathing, redness and pain at the surgery site, drainage, fever, or pain not relieved by medication.    4) Additional Instructions: Follow written instructions provided to you by [facility].        Please contact your physician with any problems or Same Day Surgery at [phone], Monday through Friday 6 am to 4 pm, or [place] at [facility] number at [phone].

## 2020-01-08 NOTE — Brief Op Note (Signed)
01/08/2020  1:13 PM  PATIENT:  Jeffery Everett  24 y.o. male  PRE-OPERATIVE DIAGNOSIS:  Left 74mm distal ureteral stone  POST-OPERATIVE DIAGNOSIS:  Same  PROCEDURE:  Procedure(s): EXTRACORPOREAL SHOCK WAVE LITHOTRIPSY (ESWL) (Left)  SURGEON:  Surgeon(s) and Role:    * Sondra Come, MD - Primary  ANESTHESIA: Conscious Sedation  EBL:  None  Drains: None  Specimen: None  Findings:  1. Unable to visualize stone on flouroscopy 2. Contrast given and dilated ureter with area of narrowing clearly visualized and targeted, contrast still did not pass at conclusion of case  DISPO: Flomax, pain meds PRN, RTC 2 weeks KUB If persistent fragments will require ureteroscopy/laser lithotripsy  Legrand Rams, MD 01/08/2020

## 2020-01-08 NOTE — OR Nursing (Signed)
Small area of bruising noted to left lower abdomen from procedure.  Contacted office for f/u, they will contact patient with scheduled time.

## 2020-01-09 ENCOUNTER — Other Ambulatory Visit: Payer: Self-pay | Admitting: Radiology

## 2020-01-09 ENCOUNTER — Encounter: Payer: Self-pay | Admitting: Urology

## 2020-01-09 DIAGNOSIS — N201 Calculus of ureter: Secondary | ICD-10-CM

## 2020-01-12 LAB — MICROSCOPIC EXAMINATION: Bacteria, UA: NONE SEEN

## 2020-01-12 LAB — URINALYSIS, COMPLETE
Bilirubin, UA: NEGATIVE
Glucose, UA: NEGATIVE
Ketones, UA: NEGATIVE
Leukocytes,UA: NEGATIVE
Nitrite, UA: NEGATIVE
Protein,UA: NEGATIVE
Specific Gravity, UA: >1.03 — ABNORMAL HIGH (ref 1.005–1.030)
Urobilinogen, Ur: 0.2 mg/dL (ref 0.2–1.0)
pH, UA: 6 (ref 5.0–7.5)

## 2020-01-12 NOTE — Telephone Encounter (Signed)
Error

## 2020-01-21 NOTE — Progress Notes (Deleted)
01/22/2020 3:17 PM   Jeffery Everett [birth year] [id number]  Referring provider: Elby Beck, [place],  [place] [address]  No chief complaint on file.   HPI: Jeffery Everett is a 24 y.o. male with nephrolithiasis, left hydronephrosis and a possible left ureteral stricture who presents today for a follow up after ESWL.  Nephrolithiasis CT renal stone study 08/29/2019 Mild left hydroureteronephrosis secondary to 6 mm distal left ureteral calculus.  He underwent left URS with Dr. Erlene Quan on September 04, 2019.  He then returned on September 09, 2019 for cystoscopy with stent removal.  He was found to have persistent hydronephrosis on follow-up imaging, please see discussion under left hydronephrosis and a possible left ureteral stricture.  RUS 10/03/2019 Persistent moderate left hydronephrosis, similar relative to previous CT from 08/29/2019.  Normal right kidney.  KUB 10/07/2019 Negative.  No radiopaque calculi identified.  RUS 11/07/2019 Persistent moderate left hydronephrosis, not appreciably changed since 10/03/2019 renal sonogram. Bilateral ureteral jets are seen in the bladder. Repeat CT evaluation may be considered as clinically warranted.  Diffuse hepatic steatosis.  CT Renal stone study 01/06/2020 5 mm mid left ureteral stone with left hydronephrosis.  Fatty infiltration of the liver.  KUB today no calculi identified.  UA is negative for micro heme.  He underwent left ESWL on 01/15/2020 with Dr. Diamantina Providence.    Hydronephrosis with possible left ureteral stricture An area of focal left distal ureteral narrowing was appreciated during ureteroscopy performed for definitive stone management in February 2021.  Persistent left hydronephrosis was noted on follow-up renal ultrasounds x2.  It was recommended to the patient that he undergo a CT scan to evaluate for any persistent ureteral calculi and then there was consideration for a renal Lasix study if CT scan did  not identify any ureteral calculi.  Patient did not follow-up with this treatment plan as recommended.  PMH: Past Medical History:  Diagnosis Date   ADHD (attention deficit hyperactivity disorder)     Surgical History: Past Surgical History:  Procedure Laterality Date   CYSTOSCOPY/URETEROSCOPY/HOLMIUM LASER/STENT PLACEMENT Left 09/04/2019   Procedure: CYSTOSCOPY/URETEROSCOPY/HOLMIUM LASER/STENT PLACEMENT;  Surgeon: Hollice Espy, MD;  Location: [facility] ORS;  Service: Urology;  Laterality: Left;   EXTRACORPOREAL SHOCK WAVE LITHOTRIPSY Left 01/08/2020   Procedure: EXTRACORPOREAL SHOCK WAVE LITHOTRIPSY (ESWL);  Surgeon: Billey Co, MD;  Location: [facility] ORS;  Service: Urology;  Laterality: Left;   NO PAST SURGERIES     Verified with patient (11/25/15)    Home Medications:  Allergies as of 01/22/2020   No Known Allergies     Medication List       Accurate as of January 21, 2020  3:17 PM. If you have any questions, ask your nurse or doctor.        amphetamine-dextroamphetamine 20 MG 24 hr capsule Commonly known as: ADDERALL XR Take 1 capsule (20 mg total) by mouth daily. May fill 60 days after date on prescription.   cetirizine 10 MG tablet Commonly known as: ZYRTEC Take 10 mg by mouth every morning.   docusate sodium 100 MG capsule Commonly known as: COLACE Take 1 capsule (100 mg total) by mouth 2 (two) times daily.   fluticasone 50 MCG/ACT nasal spray Commonly known as: FLONASE Place 1 spray into both nostrils daily.   ibuprofen 600 MG tablet Commonly known as: ADVIL Take 1 tablet (600 mg total) by mouth every 6 (six) hours as needed.   sulfamethoxazole-trimethoprim 800-160 MG tablet Commonly known as: BACTRIM DS  Take 1 tablet by mouth 2 (two) times daily.   tamsulosin 0.4 MG Caps capsule Commonly known as: Flomax Take 1 capsule (0.4 mg total) by mouth daily.       Allergies: No Known Allergies  Family History: Family History  Problem Relation Age of Onset    Hypertension Mother    ADD / ADHD Father    Hypertension Paternal Grandmother    Bipolar disorder Paternal Grandmother    Fibromyalgia Paternal Grandmother    COPD Paternal Grandmother    Heart disease Paternal Grandfather     Social History:  reports that he has never smoked. He has never used smokeless tobacco. He reports current drug use. Drug: Morphine. He reports that he does not drink alcohol.  ROS: Pertinent ROS in HPI  Physical Exam: There were no vitals taken for this visit.  Constitutional:  Well nourished. Alert and oriented, No acute distress. HEENT: [place] AT, moist mucus membranes.  Trachea midline, no masses. Cardiovascular: No clubbing, cyanosis, or edema. Respiratory: Normal respiratory effort, no increased work of breathing. GI: Abdomen is soft, non tender, non distended, no abdominal masses. Liver and spleen not palpable.  No hernias appreciated.  Stool sample for occult testing is not indicated.   GU: No CVA tenderness.  No bladder fullness or masses.  Patient with circumcised/uncircumcised phallus. ***Foreskin easily retracted***  Urethral meatus is patent.  No penile discharge. No penile lesions or rashes. Scrotum without lesions, cysts, rashes and/or edema.  Testicles are located scrotally bilaterally. No masses are appreciated in the testicles. Left and right epididymis are normal. Rectal: Patient with  normal sphincter tone. Anus and perineum without scarring or rashes. No rectal masses are appreciated. Prostate is approximately *** grams, *** nodules are appreciated. Seminal vesicles are normal. Skin: No rashes, bruises or suspicious lesions. Lymph: No cervical or inguinal adenopathy. Neurologic: Grossly intact, no focal deficits, moving all 4 extremities. Psychiatric: Normal mood and affect.  Laboratory Data: Lab Results  Component Value Date   WBC 8.1 01/06/2020   HGB 14.1 01/06/2020   HCT 41.9 01/06/2020   MCV 91.5 01/06/2020   PLT 294 01/06/2020      Lab Results  Component Value Date   CREATININE 1.23 01/06/2020     Lab Results  Component Value Date   HGBA1C 5.5 07/30/2019    Lab Results  Component Value Date   TSH 2.42 01/25/2018       Component Value Date/Time   CHOL 190 07/30/2019 1140   HDL 62.50 07/30/2019 1140   CHOLHDL 3 07/30/2019 1140   VLDL 16.4 07/30/2019 1140   LDLCALC 111 (H) 07/30/2019 1140    Lab Results  Component Value Date   AST 39 08/29/2019   Lab Results  Component Value Date   ALT 86 (H) 08/29/2019    Urinalysis Component     Latest Ref Rng & Units 01/07/2020  Specific Gravity, UA     1.005 - 1.030 >1.030 (H)  pH, UA     5.0 - 7.5 6.0  Color, UA     Yellow Yellow  Appearance Ur     Clear Hazy (A)  Leukocytes,UA     Negative Negative  Protein,UA     Negative/Trace Negative  Glucose, UA     Negative Negative  Ketones, UA     Negative Negative  RBC, UA     Negative Trace (A)  Bilirubin, UA     Negative Negative  Urobilinogen, Ur     0.2 - 1.0 mg/dL  0.2  Nitrite, UA     Negative Negative  Microscopic Examination      See below:   Component     Latest Ref Rng & Units 01/07/2020  WBC, UA     0 - 5 /hpf 6-10 (A)  RBC     0 - 2 /hpf 0-2  Epithelial Cells (non renal)     0 - 10 /hpf 0-10  Bacteria, UA     None seen/Few None seen   I have reviewed the labs.   Pertinent Imaging: ***  Assessment & Plan:    1. Left ureteral stone SD 333 HU   Skin to stone distance 13 cm  Stone not visible on KUB Explained to the patient that since their stone is  ?10 mm, it is in within AUA Guidelines to continue MET with tamsulosin and pushing fluids for 4 to 6 weeks -patient is not wanting to pursue MET therapy at this time.  He tolerated his stent poorly with his previous ureteroscopy.  His case is discussed with Dr. Erlene Quan and Dr. Diamantina Providence and will attempt ESWL at this time.  Contrast will be used to identify the location of his stone.  Explained to the patient that with ESWL,  shock waves are focused on the stone using X-rays to pinpoint the stone.  The shock waves are fired repeatedly which usually causes the stone to break into small pieces which pass out in the urine over the next few weeks.  They will go home that day and may be able to resume normal activities in three days.  They should be given a strainer and they need to collect any fragments/sediments that they pass for analysis.   Risks involved with the procedure consist of bruising of the skin and kidney, possible long-term kidney damage, development of HTN, damage to the bowel, spleen, liver, pancreas, male organs or lungs, hematuria, hematuria serious enough to require transfusion or surgical repair, formation of a hematoma, infection or sepsis.  If the stone is too large or dense, it may not break apart or break apart in large pieces and cause "Steinstrasse."  If this happens, it would result in the need for another procedure (likely URS/LL/stent placement).  IV sedation is typically used, but in rare instances general anesthesia is used with the risk of irregular heart beat, irregular BP, stroke, MI, CVA, paralysis, coma and/or death.  2. Left hydronephrosis Will obtain RUS to ensure the hydronephrosis has resolved once they have passed and/or recovered from procedure as he has a possible history of left ureteral stricture If hydronephrosis persists, see discussion under possible left ureteral stricture  3. Microsopic hematuria UA from the ED visit yesterday was positive for greater than 50 RBCs Continue to monitor the patient's UA after the treatment/passage of the stone to ensure the hematuria has resolved If hematuria persists, we will pursue a hematuria workup with CT Urogram and cystoscopy if appropriate.  4. 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   No follow-ups on file.  These notes generated with voice recognition software. I apologize for typographical errors.  Zara Council, PA-C  [facility] [address]  [place], [place] [address] [phone]

## 2020-01-22 ENCOUNTER — Ambulatory Visit: Payer: BC Managed Care – PPO | Admitting: Urology

## 2020-01-22 NOTE — Progress Notes (Signed)
01/23/2020 11:33 AM   [name] 01-11-1996 [id number]  Referring provider: Emi Belfast, FNP [address] [place],  [place] [address]  Chief Complaint  Patient presents with  . uteral stone    HPI: Jeffery Everett is a 24 y.o. male with nephrolithiasis, left hydronephrosis and a possible left ureteral stricture who presents today for a follow up after ESWL.  Nephrolithiasis CT renal stone study 08/29/2019 Mild left hydroureteronephrosis secondary to 6 mm distal left ureteral calculus.  He underwent left URS with Dr. Apolinar Junes on September 04, 2019.  He then returned on September 09, 2019 for cystoscopy with stent removal.  He was found to have persistent hydronephrosis on follow-up imaging, please see discussion under left hydronephrosis and a possible left ureteral stricture.  RUS 10/03/2019 Persistent moderate left hydronephrosis, similar relative to previous CT from 08/29/2019.  Normal right kidney.  KUB 10/07/2019 Negative.  No radiopaque calculi identified.  RUS 11/07/2019 Persistent moderate left hydronephrosis, not appreciably changed since 10/03/2019 renal sonogram. Bilateral ureteral jets are seen in the bladder. Repeat CT evaluation may be considered as clinically warranted.  Diffuse hepatic steatosis.  CT Renal stone study 01/06/2020 5 mm mid left ureteral stone with left hydronephrosis.  Fatty infiltration of the liver.  KUB today no calculi identified.  UA is negative for micro heme.  He underwent left ESWL on 01/15/2020 with Dr. Richardo Hanks.  He has passed some fragments, but he did not bring in the fragments today.  Patient denies any modifying or aggravating factors.  Patient denies any gross hematuria, dysuria or suprapubic/flank pain.  Patient denies any fevers, chills, nausea or vomiting.   Hydronephrosis with possible left ureteral stricture An area of focal left distal ureteral narrowing was appreciated during ureteroscopy performed for definitive stone  management in February 2021.  Persistent left hydronephrosis was noted on follow-up renal ultrasounds x2.  It was recommended to the patient that he undergo a CT scan to evaluate for any persistent ureteral calculi and then there was consideration for a renal Lasix study if CT scan did not identify any ureteral calculi.  Patient did not follow-up with this treatment plan as recommended.  PMH: Past Medical History:  Diagnosis Date  . ADHD (attention deficit hyperactivity disorder)     Surgical History: Past Surgical History:  Procedure Laterality Date  . CYSTOSCOPY/URETEROSCOPY/HOLMIUM LASER/STENT PLACEMENT Left 09/04/2019   Procedure: CYSTOSCOPY/URETEROSCOPY/HOLMIUM LASER/STENT PLACEMENT;  Surgeon: [name], MD;  Location: [facility];  Service: Urology;  Laterality: Left;  . EXTRACORPOREAL SHOCK WAVE LITHOTRIPSY Left 01/08/2020   Procedure: EXTRACORPOREAL SHOCK WAVE LITHOTRIPSY (ESWL);  Surgeon: Sondra Come, MD;  Location: [facility];  Service: Urology;  Laterality: Left;  . NO PAST SURGERIES     Verified with patient (11/25/15)    Home Medications:  Allergies as of 01/23/2020   No Known Allergies     Medication List       Accurate as of January 23, 2020 11:33 AM. If you have any questions, ask your nurse or doctor.        STOP taking these medications   sulfamethoxazole-trimethoprim 800-160 MG tablet Commonly known as: BACTRIM DS Stopped by: Lux Skilton, PA-C     TAKE these medications   amphetamine-dextroamphetamine 20 MG 24 hr capsule Commonly known as: ADDERALL XR Take 1 capsule (20 mg total) by mouth daily. May fill 60 days after date on prescription.   cetirizine 10 MG tablet Commonly known as: ZYRTEC Take 10 mg by mouth every morning.  docusate sodium 100 MG capsule Commonly known as: COLACE Take 1 capsule (100 mg total) by mouth 2 (two) times daily.   fluticasone 50 MCG/ACT nasal spray Commonly known as: FLONASE Place 1 spray into both nostrils daily.     ibuprofen 600 MG tablet Commonly known as: ADVIL Take 1 tablet (600 mg total) by mouth every 6 (six) hours as needed.   tamsulosin 0.4 MG Caps capsule Commonly known as: Flomax Take 1 capsule (0.4 mg total) by mouth daily.       Allergies: No Known Allergies  Family History: Family History  Problem Relation Age of Onset  . Hypertension Mother   . ADD / ADHD Father   . Hypertension Paternal Grandmother   . Bipolar disorder Paternal Grandmother   . Fibromyalgia Paternal Grandmother   . COPD Paternal Grandmother   . Heart disease Paternal Grandfather     Social History:  reports that he has never smoked. He has never used smokeless tobacco. He reports current drug use. Drug: Morphine. He reports that he does not drink alcohol.  ROS: Pertinent ROS in HPI  Physical Exam: BP 127/80   Pulse 74   Ht 6\' 3"  (1.905 m)   Wt (!) 320 lb (145.2 kg)   BMI 40.00 kg/m   Constitutional:  Well nourished. Alert and oriented, No acute distress. HEENT: [place] AT, mask in place.  Trachea midline Cardiovascular: No clubbing, cyanosis, or edema. Respiratory: Normal respiratory effort, no increased work of breathing. Neurologic: Grossly intact, no focal deficits, moving all 4 extremities. Psychiatric: Normal mood and affect.  Laboratory Data: Lab Results  Component Value Date   WBC 8.1 01/06/2020   HGB 14.1 01/06/2020   HCT 41.9 01/06/2020   MCV 91.5 01/06/2020   PLT 294 01/06/2020    Lab Results  Component Value Date   CREATININE 1.23 01/06/2020     Lab Results  Component Value Date   HGBA1C 5.5 07/30/2019    Lab Results  Component Value Date   TSH 2.42 01/25/2018       Component Value Date/Time   CHOL 190 07/30/2019 1140   HDL 62.50 07/30/2019 1140   CHOLHDL 3 07/30/2019 1140   VLDL 16.4 07/30/2019 1140   LDLCALC 111 (H) 07/30/2019 1140    Lab Results  Component Value Date   AST 39 08/29/2019   Lab Results  Component Value Date   ALT 86 (H) 08/29/2019     Urinalysis Component     Latest Ref Rng & Units 01/07/2020  Specific Gravity, UA     1.005 - 1.030 >1.030 (H)  pH, UA     5.0 - 7.5 6.0  Color, UA     Yellow Yellow  Appearance Ur     Clear Hazy (A)  Leukocytes,UA     Negative Negative  Protein,UA     Negative/Trace Negative  Glucose, UA     Negative Negative  Ketones, UA     Negative Negative  RBC, UA     Negative Trace (A)  Bilirubin, UA     Negative Negative  Urobilinogen, Ur     0.2 - 1.0 mg/dL 0.2  Nitrite, UA     Negative Negative  Microscopic Examination      See below:   Component     Latest Ref Rng & Units 01/07/2020  WBC, UA     0 - 5 /hpf 6-10 (A)  RBC     0 - 2 /hpf 0-2  Epithelial Cells (non renal)  0 - 10 /hpf 0-10  Bacteria, UA     None seen/Few None seen   I have reviewed the labs.   Pertinent Imaging: CLINICAL DATA:  Left ureteral calculus  EXAM: ABDOMEN - 1 VIEW  COMPARISON:  01/07/2020.  CT abdomen pelvis 01/06/2020  FINDINGS: Previously noted left ureteral calculus is not visualized however it did overlie the sacrum on the CT. No other renal calculi. Normal bowel gas pattern.  IMPRESSION: Negative.   Electronically Signed   By: Marlan Palau M.D.   On: 01/23/2020 16:08 I have independently reviewed the films.  See HPI.   Assessment & Plan:    1. Left ureteral stone Patient is s/p left ESWL Passed fragments, but he did not bring them for analysis  2. Left hydronephrosis Will obtain RUS at this time to ensure the hydronephrosis has resolved   3. 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   Return in about 1 month (around 02/23/2020) for RUS report and recheck UA.  These notes generated with voice recognition software. I apologize for typographical errors.  Michiel Cowboy, PA-C  [facility] [address]  [address] [place], [place] [address] [phone]

## 2020-01-23 ENCOUNTER — Other Ambulatory Visit: Payer: Self-pay

## 2020-01-23 ENCOUNTER — Ambulatory Visit
Admission: RE | Admit: 2020-01-23 | Discharge: 2020-01-23 | Disposition: A | Discharge status: Home / Self Care | Payer: BC Managed Care – PPO | Source: Ambulatory Visit | Attending: Urology | Admitting: Urology

## 2020-01-23 ENCOUNTER — Ambulatory Visit
Admission: RE | Admit: 2020-01-23 | Discharge: 2020-01-23 | Disposition: A | Discharge status: Home / Self Care | Payer: BC Managed Care – PPO | Attending: Urology | Admitting: Urology

## 2020-01-23 ENCOUNTER — Ambulatory Visit (INDEPENDENT_AMBULATORY_CARE_PROVIDER_SITE_OTHER): Payer: BC Managed Care – PPO | Admitting: Urology

## 2020-01-23 ENCOUNTER — Encounter: Payer: Self-pay | Admitting: Urology

## 2020-01-23 VITALS — BP 127/80 | HR 74 | Ht 75.0 in | Wt 320.0 lb

## 2020-01-23 DIAGNOSIS — R3129 Other microscopic hematuria: Secondary | ICD-10-CM

## 2020-01-23 DIAGNOSIS — N201 Calculus of ureter: Secondary | ICD-10-CM

## 2020-01-23 DIAGNOSIS — N1330 Unspecified hydronephrosis: Secondary | ICD-10-CM

## 2020-02-25 ENCOUNTER — Other Ambulatory Visit: Payer: Self-pay

## 2020-02-25 ENCOUNTER — Ambulatory Visit
Admission: RE | Admit: 2020-02-25 | Discharge: 2020-02-25 | Disposition: A | Discharge status: Home / Self Care | Payer: BC Managed Care – PPO | Source: Ambulatory Visit | Attending: Urology | Admitting: Urology

## 2020-02-25 DIAGNOSIS — R3129 Other microscopic hematuria: Secondary | ICD-10-CM | POA: Diagnosis present

## 2020-02-25 DIAGNOSIS — N1330 Unspecified hydronephrosis: Secondary | ICD-10-CM | POA: Insufficient documentation

## 2020-02-25 DIAGNOSIS — N201 Calculus of ureter: Secondary | ICD-10-CM | POA: Insufficient documentation

## 2020-02-26 NOTE — Progress Notes (Deleted)
02/27/2020 9:01 AM   [name] [birth year] [id number]  Referring provider: Emi Belfast, FNP [address] [place],  [place] [address]  No chief complaint on file.   HPI: Jeffery Everett is a 24 y.o. male with nephrolithiasis, left hydronephrosis and a possible left ureteral stricture who presents today for a follow up after ESWL to review renal ultrasound results and UA.    Nephrolithiasis CT renal stone study 08/29/2019 Mild left hydroureteronephrosis secondary to 6 mm distal left ureteral calculus.  He underwent left URS with Dr. Apolinar Junes on September 04, 2019.  He then returned on September 09, 2019 for cystoscopy with stent removal.  He was found to have persistent hydronephrosis on follow-up imaging, please see discussion under left hydronephrosis and a possible left ureteral stricture.  RUS 10/03/2019 Persistent moderate left hydronephrosis, similar relative to previous CT from 08/29/2019.  Normal right kidney.  KUB 10/07/2019 Negative.  No radiopaque calculi identified.  RUS 11/07/2019 Persistent moderate left hydronephrosis, not appreciably changed since 10/03/2019 renal sonogram. Bilateral ureteral jets are seen in the bladder. Repeat CT evaluation may be considered as clinically warranted.  Diffuse hepatic steatosis.  CT Renal stone study 01/06/2020 5 mm mid left ureteral stone with left hydronephrosis.  Fatty infiltration of the liver.  KUB today no calculi identified.  UA is negative for micro heme.  He underwent left ESWL on 01/15/2020 with Dr. Richardo Hanks.  He has passed some fragments, but he did not bring in the fragments today.  Patient denies any modifying or aggravating factors.  Patient denies any gross hematuria, dysuria or suprapubic/flank pain.  Patient denies any fevers, chills, nausea or vomiting.   Hydronephrosis with possible left ureteral stricture An area of focal left distal ureteral narrowing was appreciated during ureteroscopy performed for  definitive stone management in February 2021.  Persistent left hydronephrosis was noted on follow-up renal ultrasounds x2.  It was recommended to the patient that he undergo a CT scan to evaluate for any persistent ureteral calculi and then there was consideration for a renal Lasix study if CT scan did not identify any ureteral calculi.  Patient did not follow-up with this treatment plan as recommended.  PMH: Past Medical History:  Diagnosis Date  . ADHD (attention deficit hyperactivity disorder)     Surgical History: Past Surgical History:  Procedure Laterality Date  . CYSTOSCOPY/URETEROSCOPY/HOLMIUM LASER/STENT PLACEMENT Left 09/04/2019   Procedure: CYSTOSCOPY/URETEROSCOPY/HOLMIUM LASER/STENT PLACEMENT;  Surgeon: [name], MD;  Location: [facility];  Service: Urology;  Laterality: Left;  . EXTRACORPOREAL SHOCK WAVE LITHOTRIPSY Left 01/08/2020   Procedure: EXTRACORPOREAL SHOCK WAVE LITHOTRIPSY (ESWL);  Surgeon: Sondra Come, MD;  Location: [facility];  Service: Urology;  Laterality: Left;  . NO PAST SURGERIES     Verified with patient (11/25/15)    Home Medications:  Allergies as of 02/27/2020   No Known Allergies     Medication List       Accurate as of February 26, 2020  9:01 AM. If you have any questions, ask your nurse or doctor.        amphetamine-dextroamphetamine 20 MG 24 hr capsule Commonly known as: ADDERALL XR Take 1 capsule (20 mg total) by mouth daily. May fill 60 days after date on prescription.   cetirizine 10 MG tablet Commonly known as: ZYRTEC Take 10 mg by mouth every morning.   docusate sodium 100 MG capsule Commonly known as: COLACE Take 1 capsule (100 mg total) by mouth 2 (two) times daily.   fluticasone  50 MCG/ACT nasal spray Commonly known as: FLONASE Place 1 spray into both nostrils daily.   ibuprofen 600 MG tablet Commonly known as: ADVIL Take 1 tablet (600 mg total) by mouth every 6 (six) hours as needed.   tamsulosin 0.4 MG Caps  capsule Commonly known as: Flomax Take 1 capsule (0.4 mg total) by mouth daily.       Allergies: No Known Allergies  Family History: Family History  Problem Relation Age of Onset  . Hypertension Mother   . ADD / ADHD Father   . Hypertension Paternal Grandmother   . Bipolar disorder Paternal Grandmother   . Fibromyalgia Paternal Grandmother   . COPD Paternal Grandmother   . Heart disease Paternal Grandfather     Social History:  reports that he has never smoked. He has never used smokeless tobacco. He reports current drug use. Drug: Morphine. He reports that he does not drink alcohol.  ROS: Pertinent ROS in HPI  Physical Exam: There were no vitals taken for this visit.  Constitutional:  Well nourished. Alert and oriented, No acute distress. HEENT: [place] AT, moist mucus membranes.  Trachea midline Cardiovascular: No clubbing, cyanosis, or edema. Respiratory: Normal respiratory effort, no increased work of breathing. GI: Abdomen is soft, non tender, non distended, no abdominal masses. Liver and spleen not palpable.  No hernias appreciated.  Stool sample for occult testing is not indicated.   GU: No CVA tenderness.  No bladder fullness or masses.  Patient with circumcised/uncircumcised phallus. ***Foreskin easily retracted***  Urethral meatus is patent.  No penile discharge. No penile lesions or rashes. Scrotum without lesions, cysts, rashes and/or edema.  Testicles are located scrotally bilaterally. No masses are appreciated in the testicles. Left and right epididymis are normal. Rectal: Patient with  normal sphincter tone. Anus and perineum without scarring or rashes. No rectal masses are appreciated. Prostate is approximately *** grams, *** nodules are appreciated. Seminal vesicles are normal. Skin: No rashes, bruises or suspicious lesions. Lymph: No inguinal adenopathy. Neurologic: Grossly intact, no focal deficits, moving all 4 extremities. Psychiatric: Normal mood and  affect.   Laboratory Data: Lab Results  Component Value Date   WBC 8.1 01/06/2020   HGB 14.1 01/06/2020   HCT 41.9 01/06/2020   MCV 91.5 01/06/2020   PLT 294 01/06/2020    Lab Results  Component Value Date   CREATININE 1.23 01/06/2020     Lab Results  Component Value Date   HGBA1C 5.5 07/30/2019    Lab Results  Component Value Date   TSH 2.42 01/25/2018       Component Value Date/Time   CHOL 190 07/30/2019 1140   HDL 62.50 07/30/2019 1140   CHOLHDL 3 07/30/2019 1140   VLDL 16.4 07/30/2019 1140   LDLCALC 111 (H) 07/30/2019 1140    Lab Results  Component Value Date   AST 39 08/29/2019   Lab Results  Component Value Date   ALT 86 (H) 08/29/2019    Urinalysis *** I have reviewed the labs.   Pertinent Imaging: CLINICAL DATA:  Ureteral stone  EXAM: RENAL / URINARY TRACT ULTRASOUND COMPLETE  COMPARISON:  01/23/2020 radiograph, CT 01/06/2020, ultrasound 11/07/2019  FINDINGS: Right Kidney:  Renal measurements: 11.4 x 4.5 x 4.8 cm = volume: 129.7 mL . Echogenicity within normal limits. No mass or hydronephrosis visualized.  Left Kidney:  Renal measurements: 11.4 x 6.1 x 5.1 cm = volume: 185.9 mL. Cortical echogenicity is normal. There is mild left hydronephrosis which is decreased as compared with prior sonogram.  Bladder:  Appears normal for degree of bladder distention.  Other:  None.  IMPRESSION: 1. Normal ultrasound appearance of the right kidney. 2. Mild left hydronephrosis which appears decreased when compared to prior sonogram   Electronically Signed   By: Jasmine Pang M.D.   On: 02/25/2020 21:25  I have independently reviewed the films.  See HPI.   Assessment & Plan:    1. Left ureteral stone Patient is s/p left ESWL Passed fragments, but he did not bring them for analysis ***  2. Left hydronephrosis Mild hydronephrosis still persists  Schedule CT Renal stone study to rule out stone fragements  3.  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   No follow-ups on file.  These notes generated with voice recognition software. I apologize for typographical errors.  Michiel Cowboy, PA-C  [facility] [address]  [address] [place], [place] [address] [phone]

## 2020-02-27 ENCOUNTER — Ambulatory Visit: Payer: BC Managed Care – PPO | Admitting: Urology

## 2020-02-27 ENCOUNTER — Encounter: Payer: Self-pay | Admitting: Urology

## 2020-02-29 ENCOUNTER — Telehealth: Payer: Self-pay | Admitting: Urology

## 2020-02-29 NOTE — Telephone Encounter (Signed)
Please call [name] and have him reschedule his missed appointment from Friday for his RUS report and UA recheck.

## 2020-03-03 ENCOUNTER — Other Ambulatory Visit: Payer: Self-pay

## 2020-03-03 ENCOUNTER — Ambulatory Visit (INDEPENDENT_AMBULATORY_CARE_PROVIDER_SITE_OTHER): Payer: BC Managed Care – PPO | Admitting: Urology

## 2020-03-03 ENCOUNTER — Encounter: Payer: Self-pay | Admitting: Urology

## 2020-03-03 VITALS — BP 124/78 | HR 78 | Temp 97.9°F | Ht 75.0 in

## 2020-03-03 DIAGNOSIS — R3129 Other microscopic hematuria: Secondary | ICD-10-CM

## 2020-03-03 DIAGNOSIS — N1330 Unspecified hydronephrosis: Secondary | ICD-10-CM

## 2020-03-03 NOTE — Patient Instructions (Signed)
Follow up in 2 weeks after your CT study We will call you to schedule this.

## 2020-03-03 NOTE — Progress Notes (Signed)
03/03/2020 1:08 PM   Jeffery Everett 1995-08-06 [id number]  Referring provider: Emi Belfast, FNP [address] [place],  [place] [address]  Chief Complaint  Patient presents with  . Follow-up    HPI: Jeffery Everett is a 24 y.o. male with nephrolithiasis, left hydronephrosis and a possible left ureteral stricture who presents today for a follow up after ESWL to review renal ultrasound results and UA.    Nephrolithiasis CT renal stone study 08/29/2019 Mild left hydroureteronephrosis secondary to 6 mm distal left ureteral calculus.  He underwent left URS with Dr. Apolinar Junes on September 04, 2019.  He then returned on September 09, 2019 for cystoscopy with stent removal.  He was found to have persistent hydronephrosis on follow-up imaging, please see discussion under left hydronephrosis and a possible left ureteral stricture.  RUS 10/03/2019 Persistent moderate left hydronephrosis, similar relative to previous CT from 08/29/2019.  Normal right kidney.  KUB 10/07/2019 Negative.  No radiopaque calculi identified.  RUS 11/07/2019 Persistent moderate left hydronephrosis, not appreciably changed since 10/03/2019 renal sonogram. Bilateral ureteral jets are seen in the bladder. Repeat CT evaluation may be considered as clinically warranted.  Diffuse hepatic steatosis.  CT Renal stone study 01/06/2020 5 mm mid left ureteral stone with left hydronephrosis.  Fatty infiltration of the liver.  KUB today no calculi identified.  UA is negative for micro heme.  He underwent left ESWL on 01/15/2020 with Dr. Richardo Hanks.  RUS 02/25/2020 Normal ultrasound appearance of the right kidney.  Mild left hydronephrosis which appears decreased when compared to prior sonogram.  UA is unremarkable.  He has not yet brought his stone fragment into the office.  Patient denies any modifying or aggravating factors.  Patient denies any gross hematuria, dysuria or suprapubic/flank pain.  Patient denies any fevers, chills,  nausea or vomiting.   Hydronephrosis with possible left ureteral stricture An area of focal left distal ureteral narrowing was appreciated during ureteroscopy performed for definitive stone management in February 2021.  Persistent left hydronephrosis was noted on follow-up renal ultrasounds x2.  It was recommended to the patient that he undergo a CT scan to evaluate for any persistent ureteral calculi and then there was consideration for a renal Lasix study if CT scan did not identify any ureteral calculi.  Patient did not follow-up with this treatment plan as recommended.  RUS from 02/25/2020 still with persistent hydronephrosis.    PMH: Past Medical History:  Diagnosis Date  . ADHD (attention deficit hyperactivity disorder)     Surgical History: Past Surgical History:  Procedure Laterality Date  . CYSTOSCOPY/URETEROSCOPY/HOLMIUM LASER/STENT PLACEMENT Left 09/04/2019   Procedure: CYSTOSCOPY/URETEROSCOPY/HOLMIUM LASER/STENT PLACEMENT;  Surgeon: [name], MD;  Location: [facility];  Service: Urology;  Laterality: Left;  . EXTRACORPOREAL SHOCK WAVE LITHOTRIPSY Left 01/08/2020   Procedure: EXTRACORPOREAL SHOCK WAVE LITHOTRIPSY (ESWL);  Surgeon: Sondra Come, MD;  Location: [facility];  Service: Urology;  Laterality: Left;  . NO PAST SURGERIES     Verified with patient (11/25/15)    Home Medications:  Allergies as of 03/03/2020   No Known Allergies     Medication List       Accurate as of March 03, 2020 11:59 PM. If you have any questions, ask your nurse or doctor.        amphetamine-dextroamphetamine 20 MG 24 hr capsule Commonly known as: ADDERALL XR Take 1 capsule (20 mg total) by mouth daily. May fill 60 days after date on prescription.   cetirizine 10 MG tablet  Commonly known as: ZYRTEC Take 10 mg by mouth every morning.   docusate sodium 100 MG capsule Commonly known as: COLACE Take 1 capsule (100 mg total) by mouth 2 (two) times daily.   fluticasone 50 MCG/ACT nasal  spray Commonly known as: FLONASE Place 1 spray into both nostrils daily.   ibuprofen 600 MG tablet Commonly known as: ADVIL Take 1 tablet (600 mg total) by mouth every 6 (six) hours as needed.   tamsulosin 0.4 MG Caps capsule Commonly known as: Flomax Take 1 capsule (0.4 mg total) by mouth daily.       Allergies: No Known Allergies  Family History: Family History  Problem Relation Age of Onset  . Hypertension Mother   . ADD / ADHD Father   . Hypertension Paternal Grandmother   . Bipolar disorder Paternal Grandmother   . Fibromyalgia Paternal Grandmother   . COPD Paternal Grandmother   . Heart disease Paternal Grandfather     Social History:  reports that he has never smoked. He has never used smokeless tobacco. He reports current drug use. Drug: Morphine. He reports that he does not drink alcohol.  ROS: Pertinent ROS in HPI  Physical Exam: BP 124/78   Pulse 78   Temp 97.9 F (36.6 C)   Ht 6\' 3"  (1.905 m)   SpO2 96%   BMI 40.00 kg/m   Constitutional:  Well nourished. Alert and oriented, No acute distress. HEENT: [place] AT, mask in place.  Trachea midline Cardiovascular: No clubbing, cyanosis, or edema. Respiratory: Normal respiratory effort, no increased work of breathing. Neurologic: Grossly intact, no focal deficits, moving all 4 extremities. Psychiatric: Normal mood and affect.   Laboratory Data: Lab Results  Component Value Date   WBC 8.1 01/06/2020   HGB 14.1 01/06/2020   HCT 41.9 01/06/2020   MCV 91.5 01/06/2020   PLT 294 01/06/2020    Lab Results  Component Value Date   CREATININE 1.23 01/06/2020     Lab Results  Component Value Date   HGBA1C 5.5 07/30/2019    Lab Results  Component Value Date   TSH 2.42 01/25/2018       Component Value Date/Time   CHOL 190 07/30/2019 1140   HDL 62.50 07/30/2019 1140   CHOLHDL 3 07/30/2019 1140   VLDL 16.4 07/30/2019 1140   LDLCALC 111 (H) 07/30/2019 1140    Lab Results  Component Value Date    AST 39 08/29/2019   Lab Results  Component Value Date   ALT 86 (H) 08/29/2019    Urinalysis Component     Latest Ref Rng & Units 01/07/2020  Specific Gravity, UA     1.005 - 1.030 >1.030 (H)  pH, UA     5.0 - 7.5 6.0  Color, UA     Yellow Yellow  Appearance Ur     Clear Hazy (A)  Leukocytes,UA     Negative Negative  Protein,UA     Negative/Trace Negative  Glucose, UA     Negative Negative  Ketones, UA     Negative Negative  RBC, UA     Negative Trace (A)  Bilirubin, UA     Negative Negative  Urobilinogen, Ur     0.2 - 1.0 mg/dL 0.2  Nitrite, UA     Negative Negative  Microscopic Examination      See below:   Component     Latest Ref Rng & Units 01/07/2020  WBC, UA     0 - 5 /hpf 6-10 (A)  RBC     0 - 2 /hpf 0-2  Epithelial Cells (non renal)     0 - 10 /hpf 0-10  Bacteria, UA     None seen/Few None seen   I have reviewed the labs.   Pertinent Imaging: CLINICAL DATA:  Ureteral stone  EXAM: RENAL / URINARY TRACT ULTRASOUND COMPLETE  COMPARISON:  01/23/2020 radiograph, CT 01/06/2020, ultrasound 11/07/2019  FINDINGS: Right Kidney:  Renal measurements: 11.4 x 4.5 x 4.8 cm = volume: 129.7 mL . Echogenicity within normal limits. No mass or hydronephrosis visualized.  Left Kidney:  Renal measurements: 11.4 x 6.1 x 5.1 cm = volume: 185.9 mL. Cortical echogenicity is normal. There is mild left hydronephrosis which is decreased as compared with prior sonogram.  Bladder:  Appears normal for degree of bladder distention.  Other:  None.  IMPRESSION: 1. Normal ultrasound appearance of the right kidney. 2. Mild left hydronephrosis which appears decreased when compared to prior sonogram   Electronically Signed   By: Jasmine Pang M.D.   On: 02/25/2020 21:25  I have independently reviewed the films.  See HPI.   Assessment & Plan:    1. Left ureteral stone Patient is s/p left ESWL Passed fragments, but he did not bring them for  analysis - encouraged patient to find fragments  2. Left hydronephrosis Mild hydronephrosis still persists  Schedule CT Renal stone study to rule out stone fragments Will return for report - offered virtual - patient preferred in office   3. 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   Return in about 2 weeks (around 03/17/2020) for CT renal stone study.  These notes generated with voice recognition software. I apologize for typographical errors.  Michiel Cowboy, PA-C  [facility] [address]  [address] [place], [place] [address] [phone]

## 2020-03-05 LAB — MICROSCOPIC EXAMINATION
Bacteria, UA: NONE SEEN
Epithelial Cells (non renal): NONE SEEN /hpf (ref 0–10)
RBC, Urine: NONE SEEN /hpf (ref 0–2)

## 2020-03-05 LAB — URINALYSIS, COMPLETE
Bilirubin, UA: NEGATIVE
Glucose, UA: NEGATIVE
Ketones, UA: NEGATIVE
Leukocytes,UA: NEGATIVE
Nitrite, UA: NEGATIVE
Protein,UA: NEGATIVE
RBC, UA: NEGATIVE
Specific Gravity, UA: 1.025 (ref 1.005–1.030)
Urobilinogen, Ur: 0.2 mg/dL (ref 0.2–1.0)
pH, UA: 7 (ref 5.0–7.5)

## 2020-03-17 ENCOUNTER — Other Ambulatory Visit: Payer: Self-pay

## 2020-03-17 ENCOUNTER — Ambulatory Visit
Admission: RE | Admit: 2020-03-17 | Discharge: 2020-03-17 | Disposition: A | Discharge status: Home / Self Care | Payer: BC Managed Care – PPO | Source: Ambulatory Visit | Attending: Urology | Admitting: Urology

## 2020-03-17 DIAGNOSIS — N1330 Unspecified hydronephrosis: Secondary | ICD-10-CM | POA: Diagnosis not present

## 2020-03-17 NOTE — Progress Notes (Signed)
03/18/2020 2:02 PM   [name] [birth year] [id number]  Referring provider: Emi Belfast, FNP [address] [place],  [place] [address]  Chief Complaint  Patient presents with  . Nephrolithiasis    HPI: Jeffery Everett is a 24 y.o. male with nephrolithiasis, left hydronephrosis and a possible left ureteral stricture who presents today for a follow up after ESWL to review CT scan results.   Nephrolithiasis CT renal stone study 08/29/2019 Mild left hydroureteronephrosis secondary to 6 mm distal left ureteral calculus.  He underwent left URS with Dr. Apolinar Junes on September 04, 2019.  He then returned on September 09, 2019 for cystoscopy with stent removal.  He was found to have persistent hydronephrosis on follow-up imaging, please see discussion under left hydronephrosis and a possible left ureteral stricture.  RUS 10/03/2019 Persistent moderate left hydronephrosis, similar relative to previous CT from 08/29/2019.  Normal right kidney.  KUB 10/07/2019 Negative.  No radiopaque calculi identified.  RUS 11/07/2019 Persistent moderate left hydronephrosis, not appreciably changed since 10/03/2019 renal sonogram. Bilateral ureteral jets are seen in the bladder. Repeat CT evaluation may be considered as clinically warranted.  Diffuse hepatic steatosis.  CT Renal stone study 01/06/2020 5 mm mid left ureteral stone with left hydronephrosis.  Fatty infiltration of the liver.  KUB today no calculi identified.  UA is negative for micro heme.  He underwent left ESWL on 01/15/2020 with Dr. Richardo Hanks.  RUS 02/25/2020 Normal ultrasound appearance of the right kidney.  Mild left hydronephrosis which appears decreased when compared to prior sonogram.  He has not yet brought his stone fragment into the office.   Hydronephrosis with possible left ureteral stricture An area of focal left distal ureteral narrowing was appreciated during ureteroscopy performed for definitive stone management in  February 2021.  Persistent left hydronephrosis was noted on follow-up renal ultrasounds x2.  It was recommended to the patient that he undergo a CT scan to evaluate for any persistent ureteral calculi and then there was consideration for a renal Lasix study if CT scan did not identify any ureteral calculi.  Patient did not follow-up with this treatment plan as recommended.  RUS from 02/25/2020 still with persistent hydronephrosis.  Non contrast CT on 03/19/2020 No non-contrast CT findings of the abdomen or pelvis to explain left-sided flank pain. No evidence of urinary tract calculus or hydronephrosis.  There is fatty mural stratification of the cecum and terminal ileum, suggestive of chronic inflammatory stigmata of inflammatory bowel disease such as Crohn's. Correlate with referable clinical history, if present.  Hepatic steatosis.  Patient denies any personal history of inflammatory bowel disease or family history of inflammatory bowel disease.  He also denies any family history of colon cancer.  He states he has constipation, but he attributes that to not eating a lot of food.  He denies any diarrhea or bloody stool.  PMH: Past Medical History:  Diagnosis Date  . ADHD (attention deficit hyperactivity disorder)     Surgical History: Past Surgical History:  Procedure Laterality Date  . CYSTOSCOPY/URETEROSCOPY/HOLMIUM LASER/STENT PLACEMENT Left 09/04/2019   Procedure: CYSTOSCOPY/URETEROSCOPY/HOLMIUM LASER/STENT PLACEMENT;  Surgeon: [name], MD;  Location: [facility];  Service: Urology;  Laterality: Left;  . EXTRACORPOREAL SHOCK WAVE LITHOTRIPSY Left 01/08/2020   Procedure: EXTRACORPOREAL SHOCK WAVE LITHOTRIPSY (ESWL);  Surgeon: Sondra Come, MD;  Location: [facility];  Service: Urology;  Laterality: Left;  . NO PAST SURGERIES     Verified with patient (11/25/15)    Home Medications:  Allergies as  of 03/18/2020   No Known Allergies     Medication List       Accurate as of March 18, 2020  2:02 PM. If you have any questions, ask your nurse or doctor.        STOP taking these medications   fluticasone 50 MCG/ACT nasal spray Commonly known as: FLONASE   ibuprofen 600 MG tablet Commonly known as: ADVIL   tamsulosin 0.4 MG Caps capsule Commonly known as: Flomax     TAKE these medications   amphetamine-dextroamphetamine 20 MG 24 hr capsule Commonly known as: ADDERALL XR Take 1 capsule (20 mg total) by mouth daily. May fill 60 days after date on prescription.   cetirizine 10 MG tablet Commonly known as: ZYRTEC Take 10 mg by mouth every morning.   docusate sodium 100 MG capsule Commonly known as: COLACE Take 1 capsule (100 mg total) by mouth 2 (two) times daily.       Allergies: No Known Allergies  Family History: Family History  Problem Relation Age of Onset  . Hypertension Mother   . ADD / ADHD Father   . Hypertension Paternal Grandmother   . Bipolar disorder Paternal Grandmother   . Fibromyalgia Paternal Grandmother   . COPD Paternal Grandmother   . Heart disease Paternal Grandfather     Social History:  reports that he has never smoked. He has never used smokeless tobacco. He reports current drug use. Drug: Morphine. He reports that he does not drink alcohol.  ROS: Pertinent ROS in HPI  Physical Exam: BP 126/77   Pulse 82   Constitutional:  Well nourished. Alert and oriented, No acute distress. HEENT: [place] AT, mask in place.  Trachea midline Cardiovascular: No clubbing, cyanosis, or edema. Respiratory: Normal respiratory effort, no increased work of breathing. Neurologic: Grossly intact, no focal deficits, moving all 4 extremities. Psychiatric: Normal mood and affect.   Laboratory Data: Lab Results  Component Value Date   WBC 8.1 01/06/2020   HGB 14.1 01/06/2020   HCT 41.9 01/06/2020   MCV 91.5 01/06/2020   PLT 294 01/06/2020    Lab Results  Component Value Date   CREATININE 1.23 01/06/2020     Lab Results  Component Value  Date   HGBA1C 5.5 07/30/2019    Lab Results  Component Value Date   TSH 2.42 01/25/2018       Component Value Date/Time   CHOL 190 07/30/2019 1140   HDL 62.50 07/30/2019 1140   CHOLHDL 3 07/30/2019 1140   VLDL 16.4 07/30/2019 1140   LDLCALC 111 (H) 07/30/2019 1140    Lab Results  Component Value Date   AST 39 08/29/2019   Lab Results  Component Value Date   ALT 86 (H) 08/29/2019    Urinalysis Component     Latest Ref Rng & Units 01/07/2020  Specific Gravity, UA     1.005 - 1.030 >1.030 (H)  pH, UA     5.0 - 7.5 6.0  Color, UA     Yellow Yellow  Appearance Ur     Clear Hazy (A)  Leukocytes,UA     Negative Negative  Protein,UA     Negative/Trace Negative  Glucose, UA     Negative Negative  Ketones, UA     Negative Negative  RBC, UA     Negative Trace (A)  Bilirubin, UA     Negative Negative  Urobilinogen, Ur     0.2 - 1.0 mg/dL 0.2  Nitrite, UA  Negative Negative  Microscopic Examination      See below:   Component     Latest Ref Rng & Units 01/07/2020  WBC, UA     0 - 5 /hpf 6-10 (A)  RBC     0 - 2 /hpf 0-2  Epithelial Cells (non renal)     0 - 10 /hpf 0-10  Bacteria, UA     None seen/Few None seen   I have reviewed the labs.   Pertinent Imaging: CLINICAL DATA:  Left-sided flank pain, history of multiple kidney stones  EXAM: CT ABDOMEN AND PELVIS WITHOUT CONTRAST  TECHNIQUE: Multidetector CT imaging of the abdomen and pelvis was performed following the standard protocol without IV contrast.  COMPARISON:  01/06/2020  FINDINGS: Lower chest: No acute abnormality.  Hepatobiliary: No solid liver abnormality is seen. Hepatic steatosis. No gallstones, gallbladder wall thickening, or biliary dilatation.  Pancreas: Unremarkable. No pancreatic ductal dilatation or surrounding inflammatory changes.  Spleen: Normal in size without significant abnormality.  Adrenals/Urinary Tract: Adrenal glands are unremarkable. Kidneys  are normal, without renal calculi, solid lesion, or hydronephrosis. Bladder is unremarkable.  Stomach/Bowel: Stomach is within normal limits. Appendix appears normal. There is fatty mural stratification of the cecum and terminal ileum (series 4, image 66). No acute inflammatory findings.  Vascular/Lymphatic: No significant vascular findings are present. No enlarged abdominal or pelvic lymph nodes.  Reproductive: No mass or other significant abnormality.  Other: No abdominal wall hernia or abnormality. No abdominopelvic ascites.  Musculoskeletal: No acute or significant osseous findings.  IMPRESSION: 1. No non-contrast CT findings of the abdomen or pelvis to explain left-sided flank pain. No evidence of urinary tract calculus or hydronephrosis. 2. There is fatty mural stratification of the cecum and terminal ileum, suggestive of chronic inflammatory stigmata of inflammatory bowel disease such as Crohn's. Correlate with referable clinical history, if present. 3. Hepatic steatosis.   Electronically Signed   By: Lauralyn Primes M.D.   On: 03/17/2020 14:37  I have independently reviewed the films.  See HPI.   Assessment & Plan:    1. Left ureteral stone Patient is s/p left ESWL Passed fragments, but he did not bring them for analysis - encouraged patient to find fragments RTC in 6 months for KUB  2. Left hydronephrosis Resolved  3. Possible inflammatory bowel disease Sent CT report to PCP and patient will follow up with Mrs. Leone Payor, FNP for these findings  Return in about 6 months (around 09/18/2020) for KUB.  These notes generated with voice recognition software. I apologize for typographical errors.  Michiel Cowboy, PA-C  [facility] [address]  [address] [place], [place] [address] [phone]

## 2020-03-18 ENCOUNTER — Ambulatory Visit (INDEPENDENT_AMBULATORY_CARE_PROVIDER_SITE_OTHER): Payer: BC Managed Care – PPO | Admitting: Urology

## 2020-03-18 ENCOUNTER — Encounter: Payer: Self-pay | Admitting: Urology

## 2020-03-18 VITALS — BP 126/77 | HR 82

## 2020-03-18 DIAGNOSIS — N200 Calculus of kidney: Secondary | ICD-10-CM

## 2020-03-18 DIAGNOSIS — N132 Hydronephrosis with renal and ureteral calculous obstruction: Secondary | ICD-10-CM

## 2020-03-18 DIAGNOSIS — R933 Abnormal findings on diagnostic imaging of other parts of digestive tract: Secondary | ICD-10-CM | POA: Diagnosis not present

## 2020-03-19 NOTE — Progress Notes (Signed)
Thank you so much, will address with him at his upcoming appointment.

## 2020-04-28 ENCOUNTER — Telehealth: Payer: Self-pay | Admitting: Family Medicine

## 2020-04-28 ENCOUNTER — Ambulatory Visit: Payer: BC Managed Care – PPO | Admitting: Family Medicine

## 2020-04-28 NOTE — Telephone Encounter (Signed)
Left vm for the patient to call and reschedule from missed appt on 04/28/20 . Need to go over CT Scan. EM

## 2020-05-03 ENCOUNTER — Ambulatory Visit (INDEPENDENT_AMBULATORY_CARE_PROVIDER_SITE_OTHER): Payer: BC Managed Care – PPO | Admitting: Family Medicine

## 2020-05-03 ENCOUNTER — Other Ambulatory Visit: Payer: Self-pay

## 2020-05-03 ENCOUNTER — Encounter: Payer: Self-pay | Admitting: Family Medicine

## 2020-05-03 VITALS — BP 106/80 | HR 78 | Temp 98.4°F | Ht 74.0 in | Wt 320.5 lb

## 2020-05-03 DIAGNOSIS — F909 Attention-deficit hyperactivity disorder, unspecified type: Secondary | ICD-10-CM | POA: Diagnosis not present

## 2020-05-03 DIAGNOSIS — R935 Abnormal findings on diagnostic imaging of other abdominal regions, including retroperitoneum: Secondary | ICD-10-CM

## 2020-05-03 DIAGNOSIS — E669 Obesity, unspecified: Secondary | ICD-10-CM

## 2020-05-03 DIAGNOSIS — Z23 Encounter for immunization: Secondary | ICD-10-CM | POA: Diagnosis not present

## 2020-05-03 DIAGNOSIS — E559 Vitamin D deficiency, unspecified: Secondary | ICD-10-CM | POA: Diagnosis not present

## 2020-05-03 DIAGNOSIS — K760 Fatty (change of) liver, not elsewhere classified: Secondary | ICD-10-CM

## 2020-05-03 MED ORDER — AMPHETAMINE-DEXTROAMPHET ER 20 MG PO CP24: 20.0000 mg | ORAL_CAPSULE | ORAL | 0 refills | Status: DC

## 2020-05-03 MED ORDER — AMPHETAMINE-DEXTROAMPHET ER 20 MG PO CP24: 20.0000 mg | ORAL_CAPSULE | Freq: Every day | ORAL | 0 refills | Status: DC

## 2020-05-03 NOTE — Patient Instructions (Addendum)
Good to see you today  Please schedule your annual exam for 6 months  You will get a call about seeing gastroenterologist for your abnormal CT of your abdomen  Work on reducing your sugars and starches- avoid soda, sweet tea, breads, desserts, pasta, chips, etc. Increase your protein (not fried) and vegetables and fruits.   Over the counter vitamin D3- 2,000 IU daily

## 2020-05-03 NOTE — Progress Notes (Signed)
Subjective:    Patient ID: Jeffery Everett, male    DOB: [birth year], 24 y.o.   MRN: [id number]  HPI Chief Complaint  Patient presents with  . Follow-up    ADHD   This is a 24 yo male who presents today for follow up of ADHD. He is still working at [employer]. Would like to do something with gaming or cars. Still living at home.   ADHD- taking adderall on work days, needs to be able to focus. Working 5 days a week, 3 to close. No chest pain, no SOB.  Very rare alcohol, no recreational drug use, tobacco/vaping.  Obesity- trying to cut back on soda, increasing water. Eats when he can at work, sometimes eats before work.   Abnormal CT- fatty mural stratification of the cecum and terminal ileum. Denies abdominal pain, diarrhea.   Feels stuck in his current situation.  Has been working full-time and trying to save enough money for his own car but things keep getting in the way.  He tries to buy healthy foods but they can consumed by other people in his household.  He feels that his parents are supportive and he can talk with them.  Review of Systems Denies chest pain, SOB, palpitations, insomnia    Objective:   Physical Exam Constitutional:      General: He is not in acute distress.    Appearance: Normal appearance. He is obese. He is not ill-appearing, toxic-appearing or diaphoretic.  HENT:     Head: Normocephalic and atraumatic.  Eyes:     Conjunctiva/sclera: Conjunctivae normal.  Cardiovascular:     Rate and Rhythm: Normal rate.  Pulmonary:     Effort: Pulmonary effort is normal.  Neurological:     Mental Status: He is alert and oriented to person, place, and time.  Psychiatric:        Mood and Affect: Mood normal.        Behavior: Behavior normal.        Thought Content: Thought content normal.        Judgment: Judgment normal.       BP 106/80   Pulse 78   Temp 98.4 F (36.9 C) (Temporal)   Ht 6\' 2"  (1.88 m)   Wt (!) 320 lb 8 oz (145.4 kg)   SpO2 97%   BMI  41.15 kg/m  Wt Readings from Last 3 Encounters:  05/03/20 (!) 320 lb 8 oz (145.4 kg)  01/23/20 (!) 320 lb (145.2 kg)  01/07/20 (!) 320 lb (145.2 kg)       Assessment & Plan:  1. Abnormal abdominal CT scan -Discussed findings with patient and he is agreeable to referral - Ambulatory referral to Gastroenterology  2. Attention deficit hyperactivity disorder (ADHD), unspecified ADHD type -Can call for refill in 3 months, needs follow-up for CPE in 6 months - amphetamine-dextroamphetamine (ADDERALL XR) 20 MG 24 hr capsule; Take 1 capsule (20 mg total) by mouth daily. May fill 60 days after date on prescription.  Dispense: 30 capsule; Refill: 0 - amphetamine-dextroamphetamine (ADDERALL XR) 20 MG 24 hr capsule; Take 1 capsule (20 mg total) by mouth every morning.  Dispense: 30 capsule; Refill: 0 - amphetamine-dextroamphetamine (ADDERALL XR) 20 MG 24 hr capsule; Take 1 capsule (20 mg total) by mouth every morning.  Dispense: 30 capsule; Refill: 0  3. Obesity, Class II, BMI 35-39.9 -Discussed his diet and barriers to making healthy food choices, encouraged him to eliminate soda, sweet tea, juice and to increase protein, vegetables,  fruits and decrease fried foods and fast foods.  4. Need for influenza vaccination - Flu Vaccine QUAD 6+ mos PF IM (Fluarix Quad PF)  5. Need for Td vaccine - Td : Tetanus/diphtheria >7yo Preservative  free  6.  Vitamin D deficiency -He has been low for a couple of years now, has not been consistently taking high-dose weekly supplement.  Gave him recommendations for daily supplement to see if he can stick with this is easier. -We will recheck labs with physical  This visit occurred during the SARS-CoV-2 public health emergency.  Safety protocols were in place, including screening questions prior to the visit, additional usage of staff PPE, and extensive cleaning of exam room while observing appropriate contact time as indicated for disinfecting solutions.     Olean Ree, FNP-BC  [facility], [facility]  05/03/2020 5:36 PM

## 2020-05-11 ENCOUNTER — Encounter: Payer: Self-pay | Admitting: General Practice

## 2020-05-12 ENCOUNTER — Encounter: Payer: Self-pay | Admitting: Family Medicine

## 2020-05-12 NOTE — Progress Notes (Signed)
My chart message sent to patient asking him to call to schedule.

## 2020-05-25 ENCOUNTER — Ambulatory Visit
Admission: RE | Admit: 2020-05-25 | Discharge: 2020-05-25 | Disposition: A | Discharge status: Home / Self Care | Payer: BC Managed Care – PPO | Source: Ambulatory Visit | Attending: Urology | Admitting: Urology

## 2020-05-25 ENCOUNTER — Ambulatory Visit (INDEPENDENT_AMBULATORY_CARE_PROVIDER_SITE_OTHER): Payer: BC Managed Care – PPO | Admitting: Urology

## 2020-05-25 ENCOUNTER — Telehealth: Payer: Self-pay | Admitting: Family Medicine

## 2020-05-25 ENCOUNTER — Telehealth: Payer: Self-pay | Admitting: Urology

## 2020-05-25 ENCOUNTER — Other Ambulatory Visit: Payer: Self-pay

## 2020-05-25 ENCOUNTER — Inpatient Hospital Stay: Admission: RE | Admit: 2020-05-25 | Payer: BC Managed Care – PPO | Source: Ambulatory Visit | Admitting: Urology

## 2020-05-25 VITALS — BP 117/75 | HR 87 | Ht 74.0 in | Wt 320.0 lb

## 2020-05-25 DIAGNOSIS — R109 Unspecified abdominal pain: Secondary | ICD-10-CM

## 2020-05-25 DIAGNOSIS — N132 Hydronephrosis with renal and ureteral calculous obstruction: Secondary | ICD-10-CM | POA: Insufficient documentation

## 2020-05-25 DIAGNOSIS — N200 Calculus of kidney: Secondary | ICD-10-CM

## 2020-05-25 NOTE — Progress Notes (Signed)
05/25/2020 4:32 PM   Jeffery Everett 1996/02/16 [id number]  Referring provider: Emi Belfast, FNP [address] [place],  [place] [address]  Chief Complaint  Patient presents with  . Nephrolithiasis    HPI: Jeffery Everett is a 24 y.o. male with PMH of nephrolithiasis, left hydronephrosis and a possible left ureteral stricture who presents today for a possible kidney stone.   Nephrolithiasis - CT renal stone study 08/29/2019 Mild left hydroureteronephrosis secondary to 6 mm distal left ureteral calculus.    - URS 09/04/2019   - cysto/stent removal 09/09/2019  - RUS 10/03/2019 Persistent moderate left hydronephrosis, similar relative to previous CT from 08/29/2019.  Normal right kidney.    - KUB 10/07/2019 Negative.  No radiopaque calculi identified.    - RUS 11/07/2019 Persistent moderate left hydronephrosis, not appreciably changed since 10/03/2019 renal sonogram. Bilateral ureteral jets are seen in the bladder. Repeat CT evaluation may be considered as clinically warranted.  Diffuse hepatic steatosis.    - CT Renal stone study 01/06/2020 5 mm mid left ureteral stone with left hydronephrosis.  Fatty infiltration of the liver.    - KUB 01/07/2020 negative  - ESWL 01/08/2020  - KUB 01/23/2020 negative  - RUS 02/25/2020 Normal ultrasound appearance of the right kidney.  Mild left hydronephrosis which appears decreased when compared to prior sonogram  - CT Renal stone study 03/17/2020 No non-contrast CT findings of the abdomen or pelvis to explain left-sided flank pain. No evidence of urinary tract calculus or hydronephrosis.  There is fatty mural stratification of the cecum and terminal ileum, suggestive of chronic inflammatory stigmata of inflammatory bowel disease such as Crohn's. Correlate with referable clinical history, if present.  Hepatic steatosis.  He presents today after experiencing left-sided flank pain that radiated to the left waist into the  left groin that started yesterday.  He describes the pain as a sharp knifelike pain.  He states the pain was so intense he vomited from the pain.  He states his father had Percocet on hand and he took those for pain and now the pain has abated.  Patient denies any modifying or aggravating factors.  Patient denies any gross hematuria or dysuria.  Patient denies any fevers, chills, nausea or vomiting.   UA negative.    - RUS 05/25/2020 Normal renal ultrasound.  - KUB 05/25/2020 No evidence of urinary tract calculi.  Unremarkable bowel gas pattern.   Hydronephrosis with possible left ureteral stricture No hydro seen on 02/2020 CT or 05/2020 RUS.   PMH: Past Medical History:  Diagnosis Date  . ADHD (attention deficit hyperactivity disorder)     Surgical History: Past Surgical History:  Procedure Laterality Date  . CYSTOSCOPY/URETEROSCOPY/HOLMIUM LASER/STENT PLACEMENT Left 09/04/2019   Procedure: CYSTOSCOPY/URETEROSCOPY/HOLMIUM LASER/STENT PLACEMENT;  Surgeon: [name], MD;  Location: [facility];  Service: Urology;  Laterality: Left;  . EXTRACORPOREAL SHOCK WAVE LITHOTRIPSY Left 01/08/2020   Procedure: EXTRACORPOREAL SHOCK WAVE LITHOTRIPSY (ESWL);  Surgeon: Sondra Come, MD;  Location: [facility];  Service: Urology;  Laterality: Left;  . NO PAST SURGERIES     Verified with patient (11/25/15)    Home Medications:  Allergies as of 05/25/2020   No Known Allergies     Medication List       Accurate as of May 25, 2020 11:59 PM. If you have any questions, ask your nurse or doctor.        amphetamine-dextroamphetamine 20 MG 24 hr capsule Commonly known as: ADDERALL XR Take 1 capsule (  20 mg total) by mouth daily. May fill 60 days after date on prescription.   amphetamine-dextroamphetamine 20 MG 24 hr capsule Commonly known as: ADDERALL XR Take 1 capsule (20 mg total) by mouth every morning.   amphetamine-dextroamphetamine 20 MG 24 hr capsule Commonly known as: ADDERALL  XR Take 1 capsule (20 mg total) by mouth every morning.   cetirizine 10 MG tablet Commonly known as: ZYRTEC Take 10 mg by mouth every morning.   docusate sodium 100 MG capsule Commonly known as: COLACE Take 1 capsule (100 mg total) by mouth 2 (two) times daily.   IBUPROFEN PO Take by mouth.       Allergies: No Known Allergies  Family History: Family History  Problem Relation Age of Onset  . Hypertension Mother   . ADD / ADHD Father   . Hypertension Paternal Grandmother   . Bipolar disorder Paternal Grandmother   . Fibromyalgia Paternal Grandmother   . COPD Paternal Grandmother   . Heart disease Paternal Grandfather     Social History:  reports that he has never smoked. He has never used smokeless tobacco. He reports current drug use. Drug: Morphine. He reports that he does not drink alcohol.  ROS: Pertinent ROS in HPI  Physical Exam: BP 117/75   Pulse 87   Ht 6\' 2"  (1.88 m)   Wt (!) 320 lb (145.2 kg)   BMI 41.09 kg/m   Constitutional:  Well nourished. Alert and oriented, No acute distress. HEENT: [place] AT, mask in place.  Trachea midline Cardiovascular: No clubbing, cyanosis, or edema. Respiratory: Normal respiratory effort, no increased work of breathing. Neurologic: Grossly intact, no focal deficits, moving all 4 extremities. Psychiatric: Normal mood and affect.   Laboratory Data: Lab Results  Component Value Date   WBC 8.1 01/06/2020   HGB 14.1 01/06/2020   HCT 41.9 01/06/2020   MCV 91.5 01/06/2020   PLT 294 01/06/2020    Lab Results  Component Value Date   CREATININE 1.23 01/06/2020     Lab Results  Component Value Date   HGBA1C 5.5 07/30/2019    Lab Results  Component Value Date   TSH 2.42 01/25/2018       Component Value Date/Time   CHOL 190 07/30/2019 1140   HDL 62.50 07/30/2019 1140   CHOLHDL 3 07/30/2019 1140   VLDL 16.4 07/30/2019 1140   LDLCALC 111 (H) 07/30/2019 1140    Lab Results  Component Value Date   AST 39  08/29/2019   Lab Results  Component Value Date   ALT 86 (H) 08/29/2019    Urinalysis Component     Latest Ref Rng & Units 05/25/2020  Specific Gravity, UA     1.005 - 1.030 >1.030 (H)  pH, UA     5.0 - 7.5 6.0  Color, UA     Yellow Yellow  Appearance Ur     Clear Clear  Leukocytes,UA     Negative Negative  Protein,UA     Negative/Trace Negative  Glucose, UA     Negative Negative  Ketones, UA     Negative Negative  RBC, UA     Negative Negative  Bilirubin, UA     Negative Negative  Urobilinogen, Ur     0.2 - 1.0 mg/dL 0.2  Nitrite, UA     Negative Negative  Microscopic Examination      See below:   Component     Latest Ref Rng & Units 05/25/2020  WBC, UA     0 -  5 /hpf 0-5  RBC     0 - 2 /hpf None seen  Epithelial Cells (non renal)     0 - 10 /hpf 0-10  Bacteria, UA     None seen/Few None seen   I have reviewed the labs.   Pertinent Imaging: CLINICAL DATA:  Acute left flank pain.  EXAM: RENAL / URINARY TRACT ULTRASOUND COMPLETE  COMPARISON:  March 17, 2020.  February 25, 2020.  FINDINGS: Right Kidney:  Renal measurements: 11.4 x 5.7 x 5.4 cm = volume: 183 mL. Echogenicity within normal limits. No mass or hydronephrosis visualized.  Left Kidney:  Renal measurements: 13.3 x 5.7 x 5.2 cm = volume: 203 mL. Echogenicity within normal limits. No mass or hydronephrosis visualized.  Bladder:  Appears normal for degree of bladder distention.  Other:  None.  IMPRESSION: Normal renal ultrasound.   Electronically Signed   By: Lupita Raider M.D.   On: 05/25/2020 15:24  CLINICAL DATA:  Left-sided pain, vomiting, urinary calculi  EXAM: ABDOMEN - 1 VIEW  COMPARISON:  03/17/2020  FINDINGS: Supine frontal views of the abdomen and pelvis demonstrate an unremarkable bowel gas pattern. No evidence of urinary tract calculi. No abdominal masses. Lung bases are clear.  IMPRESSION: 1. No evidence of urinary tract calculi. 2.  Unremarkable bowel gas pattern.   Electronically Signed   By: Sharlet Salina M.D.   On: 05/26/2020 22:28  I have independently reviewed the films.  See HPI.   Assessment & Plan:    1. Left flank pain Explained to the patient that it is not likely due to a urological etiology.  The fact that the UA, KUB and ultrasound were negative is reassuring that his pain was not due to a stone unless he passed it through the night. If the pain continues he needs to contact his primary care provider and I have encouraged him to follow-up with his gastroenterologist referral Return as scheduled in March for follow-up and x-ray  Return in about 4 months (around 09/22/2020) for KUB.  These notes generated with voice recognition software. I apologize for typographical errors.  Michiel Cowboy, PA-C  [facility] [address]  [address] [place], [place] [address] [phone]  I spent 30 minutes on the day of the encounter to include pre-visit record review, face-to-face time with the patient, and post-visit ordering of tests.

## 2020-05-25 NOTE — Telephone Encounter (Signed)
-----   Message from Harle Battiest, PA-C sent at 05/25/2020  4:00 PM EDT ----- Please let Jeffery Everett know that his ultrasound was negative for any swelling in the kidneys and with a urine that is negative for microscopic blood and an x-ray that is also negative for stones, his pain is not from a urological source.  I suggest that he get in contact with his primary care physician or the GI provider for further evaluation of his pain.

## 2020-05-25 NOTE — Telephone Encounter (Signed)
Patient notified and voiced understanding.

## 2020-05-25 NOTE — Telephone Encounter (Signed)
this pt thinks he may have a kidney stone, c/o groin pain and side pain, pt does not have pain at the moment due to taking percocet he had on hand That started yesterday

## 2020-05-25 NOTE — Telephone Encounter (Signed)
Pt called office requesting appt for what he thinks may be a kidney stone. Pt c/o groin pain and side pain that started yesterday afternoon and worsened over night. Pt states he isn't in any pain at the moment due to having to take some pain med he had on hand when the pain got to be unbearable. Appt made with a KUB prior and UA per Carollee Herter.

## 2020-05-26 LAB — URINALYSIS, COMPLETE
Bilirubin, UA: NEGATIVE
Glucose, UA: NEGATIVE
Ketones, UA: NEGATIVE
Leukocytes,UA: NEGATIVE
Nitrite, UA: NEGATIVE
Protein,UA: NEGATIVE
RBC, UA: NEGATIVE
Specific Gravity, UA: >1.03 — ABNORMAL HIGH (ref 1.005–1.030)
Urobilinogen, Ur: 0.2 mg/dL (ref 0.2–1.0)
pH, UA: 6 (ref 5.0–7.5)

## 2020-05-26 LAB — MICROSCOPIC EXAMINATION
Bacteria, UA: NONE SEEN
RBC, Urine: NONE SEEN /hpf (ref 0–2)

## 2020-05-30 LAB — CULTURE, URINE COMPREHENSIVE

## 2020-05-31 ENCOUNTER — Telehealth: Payer: Self-pay | Admitting: Family Medicine

## 2020-05-31 NOTE — Telephone Encounter (Signed)
-----   Message from Harle Battiest, PA-C sent at 05/31/2020  7:40 AM EST ----- Please let Jeffery Everett know that his urine culture was negative for infection.

## 2020-05-31 NOTE — Telephone Encounter (Signed)
LMOM informed patient of negative urine culture.  

## 2020-06-10 ENCOUNTER — Encounter: Payer: Self-pay | Admitting: Urology

## 2020-06-24 IMAGING — CR DG ABDOMEN 1V
2 series · 2 of 2 positions shown · non-contrast
Comparison: CT 08/29/2019, ultrasound 10/03/2019

CLINICAL DATA: Kidney stone

EXAM:
ABDOMEN - 1 VIEW

[abdomen kub (1 of 2)]
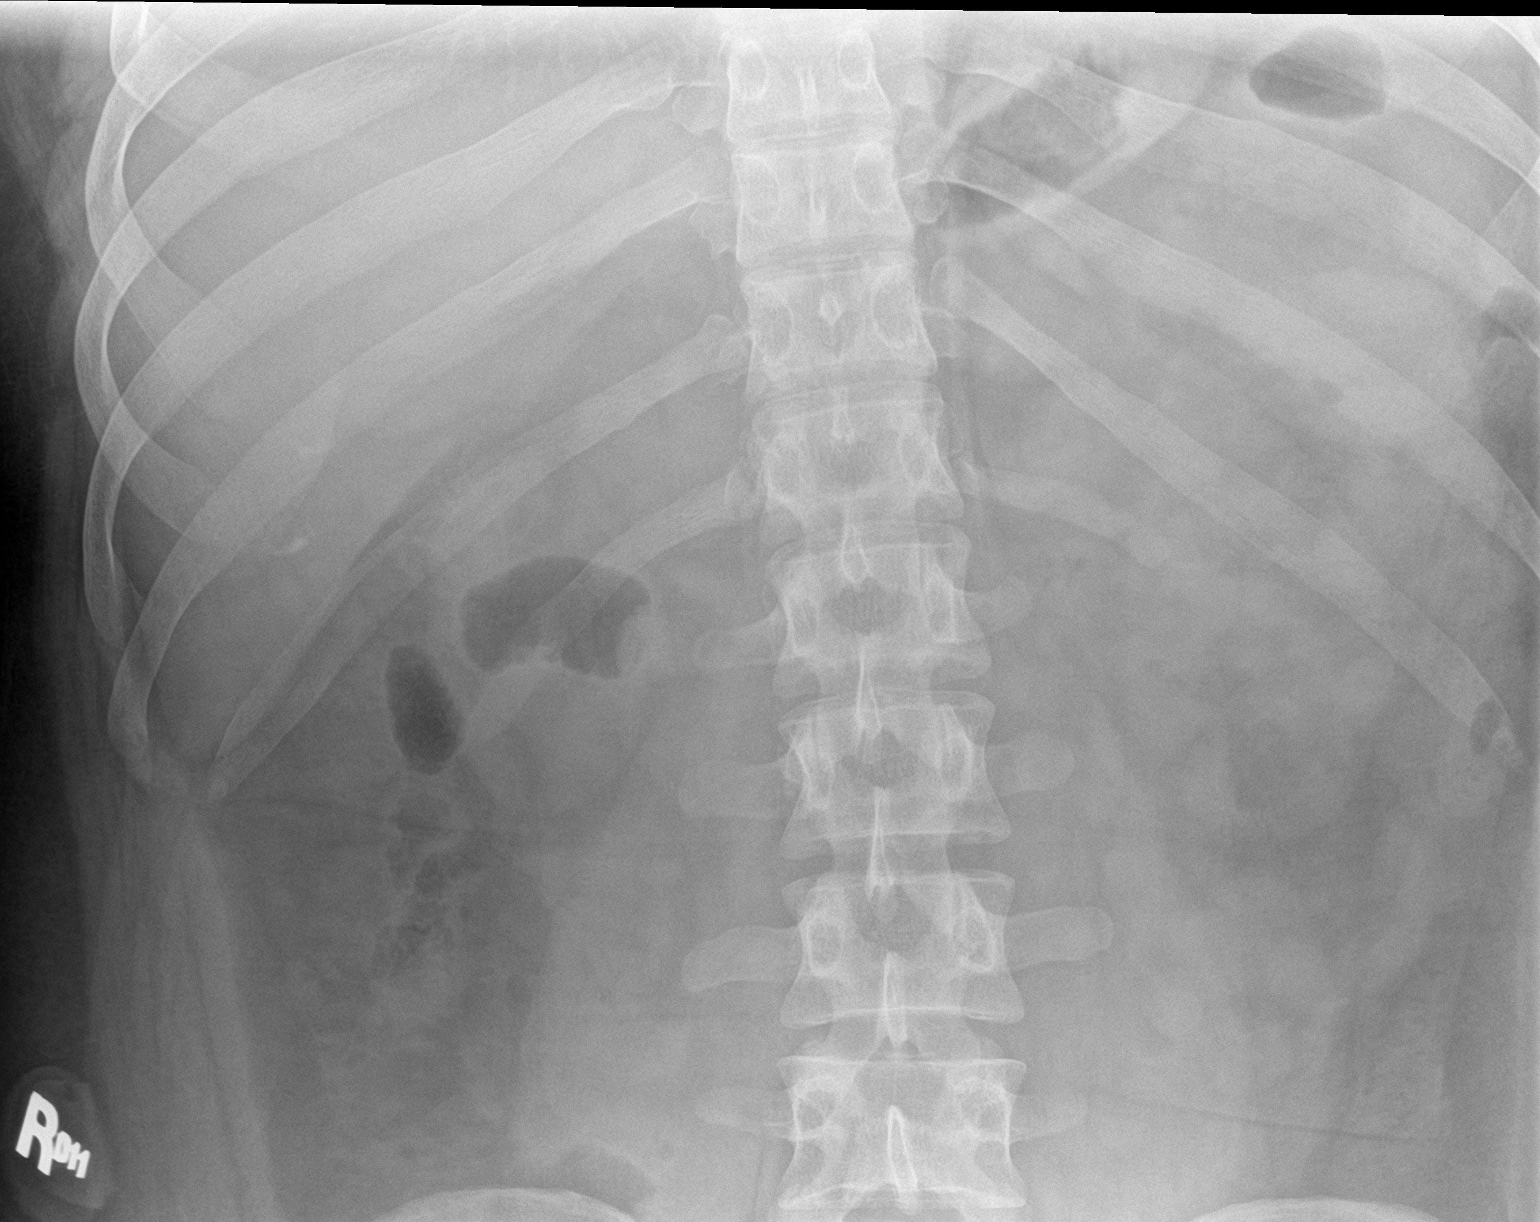

[abdomen kub (2 of 2)]
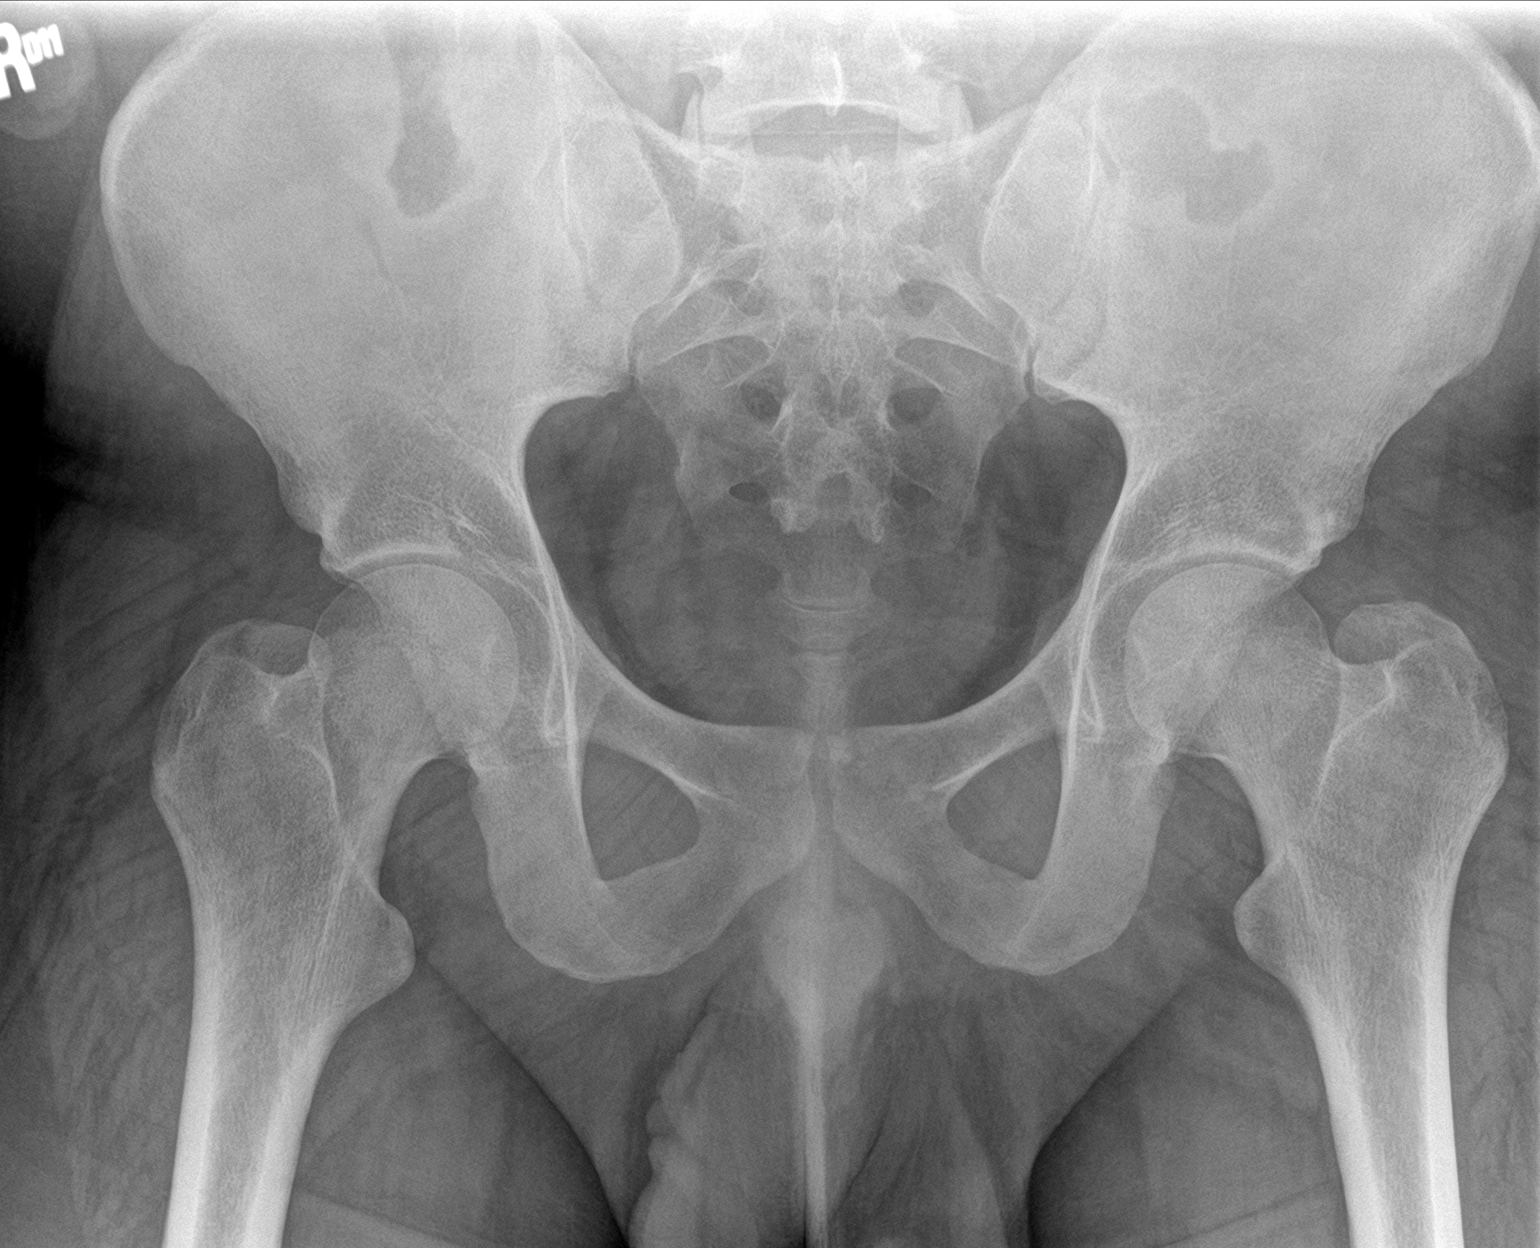

[2 of 2 positions shown; findings below may reference images not displayed]

FINDINGS: The bowel gas pattern is normal. No radio-opaque calculi or other
significant radiographic abnormality are seen.
IMPRESSION: Negative.  No radiopaque calculi identified

## 2020-08-02 ENCOUNTER — Encounter: Payer: BC Managed Care – PPO | Admitting: Family Medicine

## 2020-08-17 ENCOUNTER — Telehealth: Payer: Self-pay | Admitting: Family Medicine

## 2020-08-17 NOTE — Telephone Encounter (Signed)
Left vm to schedule toc and cpe with provider of his choice. EM

## 2020-09-22 NOTE — Progress Notes (Signed)
09/23/2020 3:55 PM   [name] 1995/10/14 [id number]  Referring provider: Emi Belfast, FNP No address on file  Chief Complaint  Patient presents with  . Nephrolithiasis    8mo w/KUB   Urological history: 1.  Nephrolithiasis -Most recent incident was in June 2021 for ESWL -Ureteroscopy in February 2021 -RUS 11/202 negative -KUB 09/2020 punctate left lower pole stone  HPI: Jeffery Everett is a 25 y.o. male who presents for 17-month follow-up for nephrolithiasis.  He has no urinary complaints at this visit.  Patient denies any modifying or aggravating factors.  Patient denies any gross hematuria, dysuria or suprapubic/flank pain.  Patient denies any fevers, chills, nausea or vomiting.   KUB punctate left renal stone.  PMH: Past Medical History:  Diagnosis Date  . ADHD (attention deficit hyperactivity disorder)     Surgical History: Past Surgical History:  Procedure Laterality Date  . CYSTOSCOPY/URETEROSCOPY/HOLMIUM LASER/STENT PLACEMENT Left 09/04/2019   Procedure: CYSTOSCOPY/URETEROSCOPY/HOLMIUM LASER/STENT PLACEMENT;  Surgeon: [name], MD;  Location: [facility];  Service: Urology;  Laterality: Left;  . EXTRACORPOREAL SHOCK WAVE LITHOTRIPSY Left 01/08/2020   Procedure: EXTRACORPOREAL SHOCK WAVE LITHOTRIPSY (ESWL);  Surgeon: Sondra Come, MD;  Location: [facility];  Service: Urology;  Laterality: Left;  . NO PAST SURGERIES     Verified with patient (11/25/15)    Home Medications:  Allergies as of 09/23/2020   No Known Allergies     Medication List       Accurate as of September 23, 2020 11:59 PM. If you have any questions, ask your nurse or doctor.        STOP taking these medications   cetirizine 10 MG tablet Commonly known as: ZYRTEC Stopped by: SHANNON MCGOWAN, PA-C   docusate sodium 100 MG capsule Commonly known as: COLACE Stopped by: SHANNON MCGOWAN, PA-C     TAKE these medications   amphetamine-dextroamphetamine 20 MG 24 hr  capsule Commonly known as: ADDERALL XR Take 1 capsule (20 mg total) by mouth every morning.   IBUPROFEN PO Take by mouth.       Allergies: No Known Allergies  Family History: Family History  Problem Relation Age of Onset  . Hypertension Mother   . ADD / ADHD Father   . Hypertension Paternal Grandmother   . Bipolar disorder Paternal Grandmother   . Fibromyalgia Paternal Grandmother   . COPD Paternal Grandmother   . Heart disease Paternal Grandfather     Social History:  reports that he has never smoked. He has never used smokeless tobacco. He reports current drug use. Drug: Morphine. He reports that he does not drink alcohol.  ROS: Pertinent ROS in HPI  Physical Exam: BP 113/71   Pulse 94   Ht 6\' 2"  (1.88 m)   Wt (!) 323 lb (146.5 kg)   BMI 41.47 kg/m   Constitutional:  Well nourished. Alert and oriented, No acute distress. HEENT: [place] AT, mask in place.  Trachea midline Cardiovascular: No clubbing, cyanosis, or edema. Respiratory: Normal respiratory effort, no increased work of breathing. Neurologic: Grossly intact, no focal deficits, moving all 4 extremities. Psychiatric: Normal mood and affect.  Laboratory Data: No recent labs since last visit    Pertinent Imaging: Narrative & Impression  CLINICAL DATA:  Nephrolithiasis left-sided pain  EXAM: ABDOMEN - 1 VIEW  COMPARISON:  05/25/2020  FINDINGS: Nonobstructed bowel-gas pattern. Possible faint 2 mm stone over lower left kidney. Moderate stool in the colon  IMPRESSION: Possible faint 2 mm stone over the lower left  kidney.   Electronically Signed   By: Jasmine Pang M.D.   On: 09/24/2020 17:22  I have independently reviewed the films.  See HPI.   Assessment & Plan:    1.  Nephrolithiasis -Punctate left renal stone -No intervention warranted at this time  Return in about 1 year (around 09/23/2021) for 1year w/KUB.  These notes generated with voice recognition software. I apologize for  typographical errors.  Michiel Cowboy, PA-C  [facility] [address]  [address] [place], [place] [address] [phone]

## 2020-09-23 ENCOUNTER — Ambulatory Visit
Admission: RE | Admit: 2020-09-23 | Discharge: 2020-09-23 | Disposition: A | Discharge status: Home / Self Care | Payer: BC Managed Care – PPO | Source: Ambulatory Visit | Attending: Urology | Admitting: Urology

## 2020-09-23 ENCOUNTER — Ambulatory Visit (INDEPENDENT_AMBULATORY_CARE_PROVIDER_SITE_OTHER): Payer: BC Managed Care – PPO | Admitting: Urology

## 2020-09-23 ENCOUNTER — Encounter: Payer: Self-pay | Admitting: Urology

## 2020-09-23 ENCOUNTER — Other Ambulatory Visit: Payer: Self-pay

## 2020-09-23 VITALS — BP 113/71 | HR 94 | Ht 74.0 in | Wt 323.0 lb

## 2020-09-23 DIAGNOSIS — N200 Calculus of kidney: Secondary | ICD-10-CM

## 2021-01-22 ENCOUNTER — Other Ambulatory Visit: Payer: Self-pay

## 2021-01-22 ENCOUNTER — Emergency Department: Payer: BC Managed Care – PPO

## 2021-01-22 ENCOUNTER — Emergency Department
Admission: EM | Admit: 2021-01-22 | Discharge: 2021-01-22 | Disposition: A | Discharge status: Home / Self Care | Payer: BC Managed Care – PPO | Attending: Emergency Medicine | Admitting: Emergency Medicine

## 2021-01-22 DIAGNOSIS — N200 Calculus of kidney: Secondary | ICD-10-CM | POA: Diagnosis not present

## 2021-01-22 DIAGNOSIS — R109 Unspecified abdominal pain: Secondary | ICD-10-CM | POA: Diagnosis present

## 2021-01-22 LAB — URINALYSIS, COMPLETE (UACMP) WITH MICROSCOPIC
Bacteria, UA: NONE SEEN
Bilirubin Urine: NEGATIVE
Glucose, UA: NEGATIVE mg/dL
Hgb urine dipstick: NEGATIVE
Ketones, ur: NEGATIVE mg/dL
Leukocytes,Ua: NEGATIVE
Nitrite: NEGATIVE
Protein, ur: NEGATIVE mg/dL
Specific Gravity, Urine: 1.024 (ref 1.005–1.030)
Squamous Epithelial / HPF: NONE SEEN (ref 0–5)
pH: 6 (ref 5.0–8.0)

## 2021-01-22 MED ORDER — OXYCODONE-ACETAMINOPHEN 5-325 MG PO TABS: 1.0000 | ORAL_TABLET | Freq: Four times a day (QID) | ORAL | 0 refills | Status: AC | PRN

## 2021-01-22 MED ORDER — ONDANSETRON 4 MG PO TBDP
4.0000 mg | ORAL_TABLET | Freq: Three times a day (TID) | ORAL | 0 refills | Status: DC | PRN
Start: 2021-01-22 — End: 2021-02-15

## 2021-01-22 MED ORDER — KETOROLAC TROMETHAMINE 60 MG/2ML IM SOLN
30.0000 mg | Freq: Once | INTRAMUSCULAR | Status: AC
  Administered 2021-01-22: 30 mg via INTRAMUSCULAR
  Filled 2021-01-22: qty 2

## 2021-01-22 MED ORDER — ACETAMINOPHEN 325 MG PO TABS
650.0000 mg | ORAL_TABLET | Freq: Once | ORAL | Status: AC
  Administered 2021-01-22: 650 mg via ORAL
  Filled 2021-01-22: qty 2

## 2021-01-22 MED ORDER — OXYCODONE-ACETAMINOPHEN 5-325 MG PO TABS
1.0000 | ORAL_TABLET | Freq: Once | ORAL | Status: AC
  Administered 2021-01-22: 1 via ORAL
  Filled 2021-01-22: qty 1

## 2021-01-22 MED ORDER — TAMSULOSIN HCL 0.4 MG PO CAPS
0.4000 mg | ORAL_CAPSULE | Freq: Every day | ORAL | Status: DC
  Administered 2021-01-22: 0.4 mg via ORAL
  Filled 2021-01-22: qty 1

## 2021-01-22 MED ORDER — TAMSULOSIN HCL 0.4 MG PO CAPS: 0.4000 mg | ORAL_CAPSULE | Freq: Every day | ORAL | 0 refills | Status: DC

## 2021-01-22 NOTE — ED Notes (Signed)
Pt A&Ox4 ambulatory at d/c with independent steady gait, NAD. Pt verbalized understanding of d/c instructions, prescriptions and follow up care. ?

## 2021-01-22 NOTE — Discharge Instructions (Addendum)
Please take medications as prescribed.  You may also take an additional 650 mg of Tylenol with your Percocet for pain control.  Please call [facility] on Tuesday to arrange close follow-up regarding this kidney stone.  Return to the emergency department if you experience any worsening of symptoms prior to then.

## 2021-01-22 NOTE — ED Triage Notes (Signed)
Pt presents via POV c/o left flank pain since last night with N/V. Reports N/V improved with Zofran given at home. Reports pain slightly better today. Reports pain waxing and waning.

## 2021-01-22 NOTE — ED Notes (Signed)
Pts dad is here and will be driving him home

## 2021-01-23 NOTE — ED Provider Notes (Signed)
[facility] Emergency Department Provider Note  ____________________________________________   Event Date/Time   First MD Initiated Contact with Patient 01/22/21 1826     (approximate)  I have reviewed the triage vital signs and the nursing notes.   HISTORY  Chief Complaint Flank Pain   HPI Jeffery Everett is a 25 y.o. male who presents to the emergency department for evaluation of left flank pain that began last night, worsened today.  It has been associated with nausea.  He denies any dysuria, fevers, chills.  He states history of renal stone that required lithotripsy and stenting.  Denies any associated abdominal pain         Past Medical History:  Diagnosis Date   ADHD (attention deficit hyperactivity disorder)     Patient Active Problem List   Diagnosis Date Noted   Vitamin D deficiency 05/03/2020   Left ureteral stone 10/29/2019   Attention deficit hyperactivity disorder (ADHD) 06/18/2017   Obesity, Class II, BMI 35-39.9 06/18/2017   Pilonidal cyst with abscess 11/25/2015    Past Surgical History:  Procedure Laterality Date   CYSTOSCOPY/URETEROSCOPY/HOLMIUM LASER/STENT PLACEMENT Left 09/04/2019   Procedure: CYSTOSCOPY/URETEROSCOPY/HOLMIUM LASER/STENT PLACEMENT;  Surgeon: [name], MD;  Location: [facility] ORS;  Service: Urology;  Laterality: Left;   EXTRACORPOREAL SHOCK WAVE LITHOTRIPSY Left 01/08/2020   Procedure: EXTRACORPOREAL SHOCK WAVE LITHOTRIPSY (ESWL);  Surgeon: Sondra Come, MD;  Location: [facility] ORS;  Service: Urology;  Laterality: Left;   NO PAST SURGERIES     Verified with patient (11/25/15)    Prior to Admission medications   Medication Sig Start Date End Date Taking? Authorizing Provider  ondansetron (ZOFRAN ODT) 4 MG disintegrating tablet Take 1 tablet (4 mg total) by mouth every 8 (eight) hours as needed for nausea or vomiting. 01/22/21  Yes Lucy Chris, PA  oxyCODONE-acetaminophen (PERCOCET) 5-325 MG tablet  Take 1 tablet by mouth every 6 (six) hours as needed for up to 5 days for severe pain. 01/22/21 01/27/21 Yes Christabel Camire, Ruben Gottron, PA  tamsulosin (FLOMAX) 0.4 MG CAPS capsule Take 1 capsule (0.4 mg total) by mouth daily for 10 days. 01/22/21 02/01/21 Yes Dayami Taitt, Ruben Gottron, PA  amphetamine-dextroamphetamine (ADDERALL XR) 20 MG 24 hr capsule Take 1 capsule (20 mg total) by mouth every morning. Patient not taking: Reported on 09/23/2020 05/03/20   Emi Belfast, FNP  IBUPROFEN PO Take by mouth. Patient not taking: Reported on 09/23/2020    [provider]    Allergies Patient has no known allergies.  Family History  Problem Relation Age of Onset   Hypertension Mother    ADD / ADHD Father    Hypertension Paternal Grandmother    Bipolar disorder Paternal Grandmother    Fibromyalgia Paternal Grandmother    COPD Paternal Grandmother    Heart disease Paternal Grandfather     Social History Social History   Tobacco Use   Smoking status: Never   Smokeless tobacco: Never  Substance Use Topics   Alcohol use: No   Drug use: Yes    Types: Morphine    Review of Systems Constitutional: No fever/chills Eyes: No visual changes. ENT: No sore throat. Cardiovascular: Denies chest pain. Respiratory: Denies shortness of breath. Gastrointestinal: No abdominal pain.  No nausea, no vomiting.  No diarrhea.  No constipation. Genitourinary: + Left flank pain, negative for dysuria Musculoskeletal: Negative for back pain. Skin: Negative for rash. Neurological: Negative for headaches, focal weakness or numbness.   ____________________________________________   PHYSICAL EXAM:  VITAL SIGNS:  ED Triage Vitals  Enc Vitals Group     BP 01/22/21 1644 125/83     Pulse Rate 01/22/21 1644 79     Resp 01/22/21 1644 16     Temp 01/22/21 1644 98.4 F (36.9 C)     Temp Source 01/22/21 1644 Oral     SpO2 01/22/21 1644 100 %     Weight --      Height --      Head Circumference --      Peak Flow --       Pain Score 01/22/21 1645 1     Pain Loc --      Pain Edu? --      Excl. in GC? --    Constitutional: Alert and oriented. Well appearing and in no acute distress. Eyes: Conjunctivae are normal. PERRL. EOMI. Head: Atraumatic. Nose: No congestion/rhinnorhea. Mouth/Throat: Mucous membranes are moist.  Oropharynx non-erythematous. Neck: No stridor.   Cardiovascular: Normal rate, regular rhythm. Grossly normal heart sounds.  Good peripheral circulation. Respiratory: Normal respiratory effort.  No retractions. Lungs CTAB. Gastrointestinal: Soft and nontender. No distention. No abdominal bruits. No CVA tenderness. Musculoskeletal: No lower extremity tenderness nor edema.  No joint effusions. Neurologic:  Normal speech and language. No gross focal neurologic deficits are appreciated. No gait instability. Skin:  Skin is warm, dry and intact. No rash noted. Psychiatric: Mood and affect are normal. Speech and behavior are normal.  ____________________________________________   LABS (all labs ordered are listed, but only abnormal results are displayed)  Labs Reviewed  URINALYSIS, COMPLETE (UACMP) WITH MICROSCOPIC - Abnormal; Notable for the following components:      Result Value   Color, Urine YELLOW (*)    APPearance CLEAR (*)    All other components within normal limits  URINE CULTURE   ____________________________________________  RADIOLOGY  Official radiology report(s): CT Renal Stone Study  Result Date: 01/22/2021 CLINICAL DATA:  Pt presents via POV c/o left flank pain since last night with N/V. Reports N/V improved with Zofran given at home. Reports pain slightly better today. Reports pain waxing and waningFlank pain, kidney stone suspected EXAM: CT ABDOMEN AND PELVIS WITHOUT CONTRAST TECHNIQUE: Multidetector CT imaging of the abdomen and pelvis was performed following the standard protocol without IV contrast. COMPARISON:  11/16/2019 FINDINGS: Lower chest: Lung bases are clear.  Hepatobiliary: Low-attenuation throughout the liver. No biliary duct dilatation. Pancreas: Pancreas is normal. No ductal dilatation. No pancreatic inflammation. Spleen: Normal spleen Adrenals/urinary tract: Adrenal glands normal. There is moderate hydronephrosis and hydroureter of the LEFT collecting system. There is obstructing calculus in the distal LEFT ureter measuring 4 mm (image 82/2). This distal LEFT ureteral calculus is at the S1 vertebral body level is inside the pelvic brim. No nephrolithiasis.  No bladder calculi Stomach/Bowel: Stomach, small-bowel and cecum are normal. The appendix is not identified but there is no pericecal inflammation to suggest appendicitis. The colon and rectosigmoid colon are normal. Vascular/Lymphatic: Abdominal aorta is normal caliber. No periportal or retroperitoneal adenopathy. No pelvic adenopathy. Reproductive: Unremarkable Other: No free fluid. Musculoskeletal: No aggressive osseous lesion. IMPRESSION: 1. Obstructing calculus in the distal LEFT ureter with moderate hydroureteronephrosis. 2. Hepatic steatosis Electronically Signed   By: Genevive Bi M.D.   On: 01/22/2021 19:16      ____________________________________________   INITIAL IMPRESSION / ASSESSMENT AND PLAN / ED COURSE  As part of my medical decision making, I reviewed the following data within the electronic MEDICAL RECORD NUMBER Nursing notes reviewed and incorporated, Labs reviewed,  Radiograph reviewed, Notes from prior ED visits, and [place] Controlled Substance Database        Patient is a 25 year old male who presents to the emergency department for evaluation of left ankle pain that began last night.  See HPI for further details.  In triage patient has normal vital signs.  On physical exam this is grossly reassuring, no abdominal tenderness, no CVA tenderness.  Urinalysis was obtained and is negative for any bacteria, leukocytes or nitrates, negative for any hemoglobin.  CT scan was obtained given  the patient's history and demonstrates a obstructing stone, though it is 4 mm with moderate hydronephrosis.  Given these findings, discussed the case with attending Dr. Lenard Lance.  Will initiate Flomax, pain medications, dose of Toradol today given that he will likely not have lithotripsy in the next few days.  We will send the urine for culture.  Encourage close urology follow-up and gave the patient information for on-call urology.  Return precautions were discussed, patient is aware and amenable with this plan, stable at time for outpatient follow-up.        ____________________________________________   FINAL CLINICAL IMPRESSION(S) / ED DIAGNOSES  Final diagnoses:  Renal stone     ED Discharge Orders          Ordered    oxyCODONE-acetaminophen (PERCOCET) 5-325 MG tablet  Every 6 hours PRN        01/22/21 1958    tamsulosin (FLOMAX) 0.4 MG CAPS capsule  Daily        01/22/21 1958    ondansetron (ZOFRAN ODT) 4 MG disintegrating tablet  Every 8 hours PRN        01/22/21 1959             Note:  This document was prepared using Dragon voice recognition software and may include unintentional dictation errors.    Lucy Chris, PA 01/23/21 2356    Minna Antis, MD 01/25/21 [phone]

## 2021-01-24 LAB — URINE CULTURE: Culture: NO GROWTH

## 2021-01-26 ENCOUNTER — Ambulatory Visit
Admission: RE | Admit: 2021-01-26 | Discharge: 2021-01-26 | Disposition: A | Discharge status: Home / Self Care | Payer: BC Managed Care – PPO | Attending: Urology | Admitting: Urology

## 2021-01-26 ENCOUNTER — Other Ambulatory Visit: Payer: Self-pay

## 2021-01-26 ENCOUNTER — Encounter: Payer: Self-pay | Admitting: Urology

## 2021-01-26 ENCOUNTER — Ambulatory Visit
Admission: RE | Admit: 2021-01-26 | Discharge: 2021-01-26 | Disposition: A | Discharge status: Home / Self Care | Payer: BC Managed Care – PPO | Source: Ambulatory Visit | Attending: Urology | Admitting: Urology

## 2021-01-26 ENCOUNTER — Ambulatory Visit (INDEPENDENT_AMBULATORY_CARE_PROVIDER_SITE_OTHER): Payer: BC Managed Care – PPO | Admitting: Urology

## 2021-01-26 VITALS — BP 124/78 | HR 85 | Ht 74.0 in | Wt 321.0 lb

## 2021-01-26 DIAGNOSIS — N200 Calculus of kidney: Secondary | ICD-10-CM | POA: Diagnosis not present

## 2021-01-26 LAB — MICROSCOPIC EXAMINATION: Bacteria, UA: NONE SEEN

## 2021-01-26 LAB — URINALYSIS, COMPLETE
Bilirubin, UA: NEGATIVE
Glucose, UA: NEGATIVE
Ketones, UA: NEGATIVE
Leukocytes,UA: NEGATIVE
Nitrite, UA: NEGATIVE
Protein,UA: NEGATIVE
RBC, UA: NEGATIVE
Specific Gravity, UA: 1.025 (ref 1.005–1.030)
Urobilinogen, Ur: 0.2 mg/dL (ref 0.2–1.0)
pH, UA: 7 (ref 5.0–7.5)

## 2021-01-26 MED ORDER — TAMSULOSIN HCL 0.4 MG PO CAPS: 0.4000 mg | ORAL_CAPSULE | Freq: Every day | ORAL | 0 refills | Status: DC

## 2021-01-26 NOTE — Progress Notes (Signed)
01/26/2021 2:23 PM   [name] Feb 28, 1996 [id number]  Referring provider: No referring provider defined for this encounter.  Urological history: 1.  Nephrolithiasis -Most recent incident was in June 2021 for ESWL -Ureteroscopy in February 2021  Chief Complaint  Patient presents with   Nephrolithiasis   HPI: Jeffery Everett is a 25 y.o. male who presents today for left ureteral stone.  He was seen in the ED on January 22, 2021 for left-sided flank pain associated with nausea.  CT renal stone study noted 4 mm left ureteral stone.  UA negative.  Urine culture negative.    He was discharged with Percocet, tamsulosin and Zoloft.  KUB today no distinct stone visualized.  UA today is clear.    He is not experiencing pain at this time.  He has not passed a fragment as of this visit.  Patient denies any modifying or aggravating factors.  Patient denies any gross hematuria, dysuria or suprapubic/flank pain.  Patient denies any fevers, chills, nausea or vomiting.    PMH: Past Medical History:  Diagnosis Date   ADHD (attention deficit hyperactivity disorder)     Surgical History: Past Surgical History:  Procedure Laterality Date   CYSTOSCOPY/URETEROSCOPY/HOLMIUM LASER/STENT PLACEMENT Left 09/04/2019   Procedure: CYSTOSCOPY/URETEROSCOPY/HOLMIUM LASER/STENT PLACEMENT;  Surgeon: [name], MD;  Location: [facility] ORS;  Service: Urology;  Laterality: Left;   EXTRACORPOREAL SHOCK WAVE LITHOTRIPSY Left 01/08/2020   Procedure: EXTRACORPOREAL SHOCK WAVE LITHOTRIPSY (ESWL);  Surgeon: Sondra Come, MD;  Location: [facility] ORS;  Service: Urology;  Laterality: Left;   NO PAST SURGERIES     Verified with patient (11/25/15)    Home Medications:  Allergies as of 01/26/2021   No Known Allergies      Medication List        Accurate as of January 26, 2021  2:23 PM. If you have any questions, ask your nurse or doctor.          amphetamine-dextroamphetamine 20 MG 24 hr  capsule Commonly known as: ADDERALL XR Take 1 capsule (20 mg total) by mouth every morning.   IBUPROFEN PO Take by mouth.   ondansetron 4 MG disintegrating tablet Commonly known as: Zofran ODT Take 1 tablet (4 mg total) by mouth every 8 (eight) hours as needed for nausea or vomiting.   oxyCODONE-acetaminophen 5-325 MG tablet Commonly known as: Percocet Take 1 tablet by mouth every 6 (six) hours as needed for up to 5 days for severe pain.   tamsulosin 0.4 MG Caps capsule Commonly known as: FLOMAX Take 1 capsule (0.4 mg total) by mouth daily for 10 days.        Allergies: No Known Allergies  Family History: Family History  Problem Relation Age of Onset   Hypertension Mother    ADD / ADHD Father    Hypertension Paternal Grandmother    Bipolar disorder Paternal Grandmother    Fibromyalgia Paternal Grandmother    COPD Paternal Grandmother    Heart disease Paternal Grandfather     Social History:  reports that he has never smoked. He has never used smokeless tobacco. He reports current drug use. Drug: Morphine. He reports that he does not drink alcohol.  ROS: Pertinent ROS in HPI  Physical Exam: BP 124/78   Pulse 85   Ht 6\' 2"  (1.88 m)   Wt (!) 321 lb (145.6 kg)   BMI 41.21 kg/m   Constitutional:  Well nourished. Alert and oriented, No acute distress. HEENT: [place] AT, mask in place.  Trachea  midline Cardiovascular: No clubbing, cyanosis, or edema. Respiratory: Normal respiratory effort, no increased work of breathing. Neurologic: Grossly intact, no focal deficits, moving all 4 extremities. Psychiatric: Normal mood and affect.   Laboratory Data: Results for orders placed or performed during the hospital encounter of 01/22/21  Urine culture   Specimen: Urine, Random  Result Value Ref Range   Specimen Description      URINE, RANDOM Performed at [facility], [address], [place], [place] [address]    Special Requests      NONE Performed at [facility], [address] [place], [place] [address]    Culture      NO GROWTH Performed at [facility], [address], [place], [place] [address]    Report Status 01/24/2021 FINAL   Urinalysis, Complete w Microscopic  Result Value Ref Range   Color, Urine YELLOW (A) YELLOW   APPearance CLEAR (A) CLEAR   Specific Gravity, Urine 1.024 1.005 - 1.030   pH 6.0 5.0 - 8.0   Glucose, UA NEGATIVE NEGATIVE mg/dL   Hgb urine dipstick NEGATIVE NEGATIVE   Bilirubin Urine NEGATIVE NEGATIVE   Ketones, ur NEGATIVE NEGATIVE mg/dL   Protein, ur NEGATIVE NEGATIVE mg/dL   Nitrite NEGATIVE NEGATIVE   Leukocytes,Ua NEGATIVE NEGATIVE   RBC / HPF 0-5 0 - 5 RBC/hpf   WBC, UA 0-5 0 - 5 WBC/hpf   Bacteria, UA NONE SEEN NONE SEEN   Squamous Epithelial / LPF NONE SEEN 0 - 5   Mucus PRESENT    Urinalysis Component     Latest Ref Rng & Units 01/26/2021  Specific Gravity, UA     1.005 - 1.030 1.025  pH, UA     5.0 - 7.5 7.0  Color, UA     Yellow Yellow  Appearance Ur     Clear Clear  Leukocytes,UA     Negative Negative  Protein,UA     Negative/Trace Negative  Glucose, UA     Negative Negative  Ketones, UA     Negative Negative  RBC, UA     Negative Negative  Bilirubin, UA     Negative Negative  Urobilinogen, Ur     0.2 - 1.0 mg/dL 0.2  Nitrite, UA     Negative Negative  Microscopic Examination      See below:   Component     Latest Ref Rng & Units 01/26/2021  WBC, UA     0 - 5 /hpf 0-5  RBC     0 - 2 /hpf 0-2  Epithelial Cells (non renal)     0 - 10 /hpf 0-10  Bacteria, UA     None seen/Few None seen   I have reviewed the labs.   Pertinent Imaging: Narrative & Impression  CLINICAL DATA:  Nephrolithiasis. Left ureteral calculus seen on recent CT scan (01/22/2021)   EXAM: ABDOMEN - 1 VIEW   COMPARISON:  CT scan 01/22/2021   FINDINGS: No definite renal or ureteral calculi are identified but the patient's ureteral calculus was quite small and located at  the pelvic inlet. It could easily be overlooked on this exam.   The bowel gas pattern is unremarkable and the bony structures are intact.   IMPRESSION: No definite renal or ureteral calculi.     Electronically Signed   By: Rudie Meyer M.D.   On: 01/27/2021 17:43   I have independently reviewed the films.  See HPI.   Assessment & Plan:  1.  Nephrolithiasis -left ureteral stone seen on CT -no stone visualized on today's KUB -Explained that the stone may be located in front of the pelvis therefore make it difficult to see on today's KUB -At this time we can either schedule him for ureteroscopy for definitive stone treatment or continue with medical expulsive therapy and have him return next week for KUB.  If the stone is visible on next week's KUB, we could consider ESWL. -refill given on tamsulosin 0.4 mg daily  -Patient is advised that if they should start to experience pain that is not able to be controlled with pain medication, intractable nausea and/or vomiting and/or fevers greater than 103 or shaking chills to contact the office immediately or seek treatment in the emergency department for emergent intervention.     No follow-ups on file.  These notes generated with voice recognition software. I apologize for typographical errors.  Michiel Cowboy, PA-C  [facility] [address]  [address] [place], [place] [address] [phone]  I spent 25 minutes on the day of the encounter to include pre-visit record review, face-to-face time with the patient, and post-visit ordering of tests.

## 2021-02-01 NOTE — Progress Notes (Signed)
02/02/2021 2:47 PM   [name] [birth year] [id number]  Referring provider: No referring provider defined for this encounter.  Urological history: 1.  Nephrolithiasis -Most recent incident was in June 2021 for ESWL -Ureteroscopy in February 2021  Chief Complaint  Patient presents with   Nephrolithiasis    HPI: Jeffery Everett is a 25 y.o. male who presents today for left ureteral stone.  He was seen in the ED on January 22, 2021 for left-sided flank pain associated with nausea.  CT renal stone study noted 4 mm left ureteral stone.  UA negative.  Urine culture negative.    He was discharged with Percocet, tamsulosin and Zofran.   At his last visit, the stone was not able to be visualized on KUB.    He has not passed the stone as of this visit and is still having discomfort.   Patient denies any modifying or aggravating factors.  Patient denies any gross hematuria, dysuria or suprapubic/flank pain.  Patient denies any fevers, chills, nausea or vomiting.    KUB no stone visualized    PMH: Past Medical History:  Diagnosis Date   ADHD (attention deficit hyperactivity disorder)     Surgical History: Past Surgical History:  Procedure Laterality Date   CYSTOSCOPY/URETEROSCOPY/HOLMIUM LASER/STENT PLACEMENT Left 09/04/2019   Procedure: CYSTOSCOPY/URETEROSCOPY/HOLMIUM LASER/STENT PLACEMENT;  Surgeon: [name], MD;  Location: [facility] OR[facility];  Service: Urology;  Laterality: Left;   EXTRACORPOREAL SHOCK WAVE LITHOTRIPSY Left 01/08/2020   Procedure: EXTRACORPOREAL SHOCK WAVE LITHOTRIPSY (ESWL);  Surgeon: Sondra Come, MD;  Location: [facility] ORS;  Service: Urology;  Laterality: Left;   NO PAST SURGERIES     Verified with patient (11/25/15)    Home Medications:  Allergies as of 02/02/2021   No Known Allergies      Medication List        Accurate as of February 02, 2021  2:47 PM. If you have any questions, ask your nurse or doctor.           amphetamine-dextroamphetamine 20 MG 24 hr capsule Commonly known as: ADDERALL XR Take 1 capsule (20 mg total) by mouth every morning.   IBUPROFEN PO Take by mouth.   ondansetron 4 MG disintegrating tablet Commonly known as: Zofran ODT Take 1 tablet (4 mg total) by mouth every 8 (eight) hours as needed for nausea or vomiting.   tamsulosin 0.4 MG Caps capsule Commonly known as: FLOMAX Take 1 capsule (0.4 mg total) by mouth daily.        Allergies: No Known Allergies  Family History: Family History  Problem Relation Age of Onset   Hypertension Mother    ADD / ADHD Father    Hypertension Paternal Grandmother    Bipolar disorder Paternal Grandmother    Fibromyalgia Paternal Grandmother    COPD Paternal Grandmother    Heart disease Paternal Grandfather     Social History:  reports that he has never smoked. He has never used smokeless tobacco. He reports current drug use. Drug: Morphine. He reports that he does not drink alcohol.  ROS: Pertinent ROS in HPI  Physical Exam: BP 118/81   Pulse 74   Ht 6\' 2"  (1.88 m)   Wt (!) 320 lb (145.2 kg)   BMI 41.09 kg/m   Constitutional:  Well nourished. Alert and oriented, No acute distress. HEENT: [place] AT, mask in place  Trachea midline Cardiovascular: No clubbing, cyanosis, or edema. Respiratory: Normal respiratory effort, no increased work of breathing. Neurologic: Grossly intact, no focal deficits, moving  all 4 extremities. Psychiatric: Normal mood and affect.   Laboratory Data: N/A    Pertinent Imaging: Component     Latest Ref Rng & Units 01/26/2021  Specific Gravity, UA     1.005 - 1.030 1.025  pH, UA     5.0 - 7.5 7.0  Color, UA     Yellow Yellow  Appearance Ur     Clear Clear  Leukocytes,UA     Negative Negative  Protein,UA     Negative/Trace Negative  Glucose, UA     Negative Negative  Ketones, UA     Negative Negative  RBC, UA     Negative Negative  Bilirubin, UA     Negative Negative   Urobilinogen, Ur     0.2 - 1.0 mg/dL 0.2  Nitrite, UA     Negative Negative  Microscopic Examination      See below:   Component     Latest Ref Rng & Units 01/26/2021  WBC, UA     0 - 5 /hpf 0-5  RBC     0 - 2 /hpf 0-2  Epithelial Cells (non renal)     0 - 10 /hpf 0-10  Bacteria, UA     None seen/Few None seen   I have independently reviewed the films.  See HPI.   Assessment & Plan:    1.  Nephrolithiasis -reviewed that the left ureteral stone seen on CT -no stone visualized on today's KUB -reviewed that the stone may be located in front of the pelvis therefore make it difficult to see on today's KUB -At this time we can either schedule him for ureteroscopy for definitive stone treatment or continue with medical expulsive therapy and have him return in two weeks for KUB -he would like to return in 2 weeks for KUB and UA  -refill given on tamsulosin 0.4 mg daily  -Patient is advised that if they should start to experience pain that is not able to be controlled with pain medication, intractable nausea and/or vomiting and/or fevers greater than 103 or shaking chills to contact the office immediately or seek treatment in the emergency department for emergent intervention.     Return in about 2 weeks (around 02/16/2021) for KUB and UA .  These notes generated with voice recognition software. I apologize for typographical errors.  Michiel Cowboy, PA-C  [facility] [address]  [address] [place], [place] [address] [phone]

## 2021-02-02 ENCOUNTER — Ambulatory Visit
Admission: RE | Admit: 2021-02-02 | Discharge: 2021-02-02 | Disposition: A | Discharge status: Home / Self Care | Payer: BC Managed Care – PPO | Attending: Urology | Admitting: Urology

## 2021-02-02 ENCOUNTER — Other Ambulatory Visit: Payer: Self-pay

## 2021-02-02 ENCOUNTER — Ambulatory Visit
Admission: RE | Admit: 2021-02-02 | Discharge: 2021-02-02 | Disposition: A | Discharge status: Home / Self Care | Payer: BC Managed Care – PPO | Source: Ambulatory Visit | Attending: Urology | Admitting: Urology

## 2021-02-02 ENCOUNTER — Encounter: Payer: Self-pay | Admitting: Urology

## 2021-02-02 ENCOUNTER — Ambulatory Visit (INDEPENDENT_AMBULATORY_CARE_PROVIDER_SITE_OTHER): Payer: BC Managed Care – PPO | Admitting: Urology

## 2021-02-02 VITALS — BP 118/81 | HR 74 | Ht 74.0 in | Wt 320.0 lb

## 2021-02-02 DIAGNOSIS — N200 Calculus of kidney: Secondary | ICD-10-CM

## 2021-02-02 MED ORDER — TAMSULOSIN HCL 0.4 MG PO CAPS: 0.4000 mg | ORAL_CAPSULE | Freq: Every day | ORAL | 0 refills | Status: DC

## 2021-02-10 IMAGING — US US RENAL
1 series · 14 of 25 positions shown · non-contrast
Comparison: [DATE] [DATE], [DATE].  [DATE] [DATE], [DATE].

CLINICAL DATA: Acute left flank pain.

EXAM:
RENAL / URINARY TRACT ULTRASOUND COMPLETE

[Series 1: us renal · 0.28mm/px · 14 of 35 slices shown]
[im 1/35]
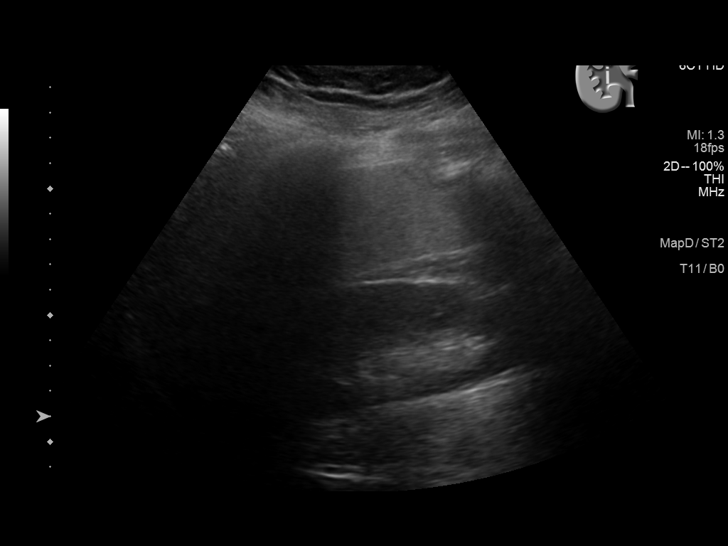
[im 3/35]
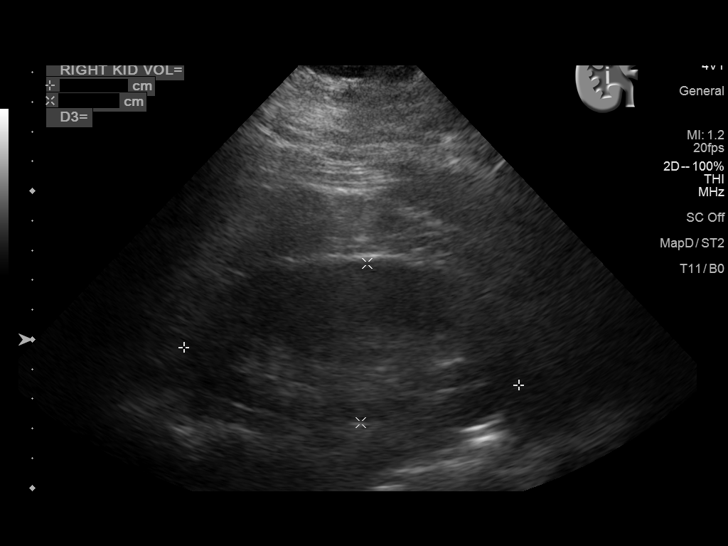
[im 6/35]
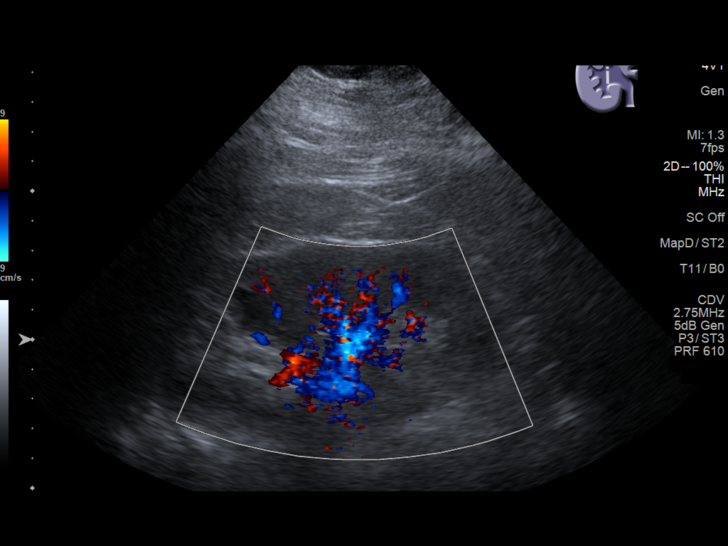
[im 9/35]
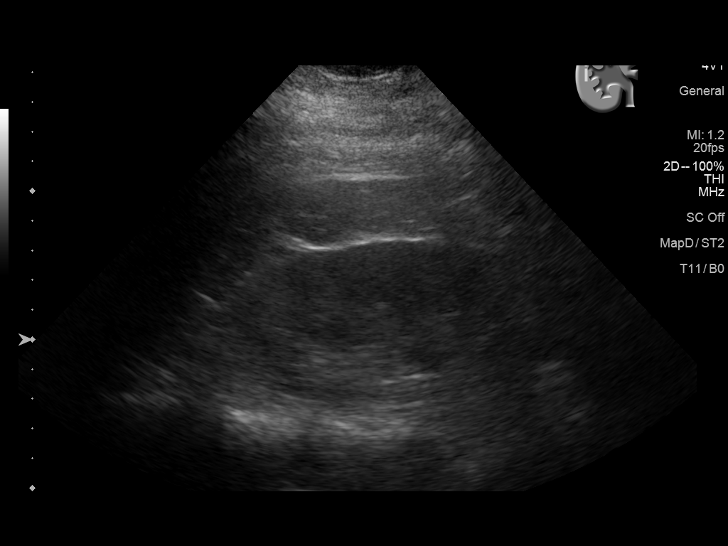
[im 12/35]
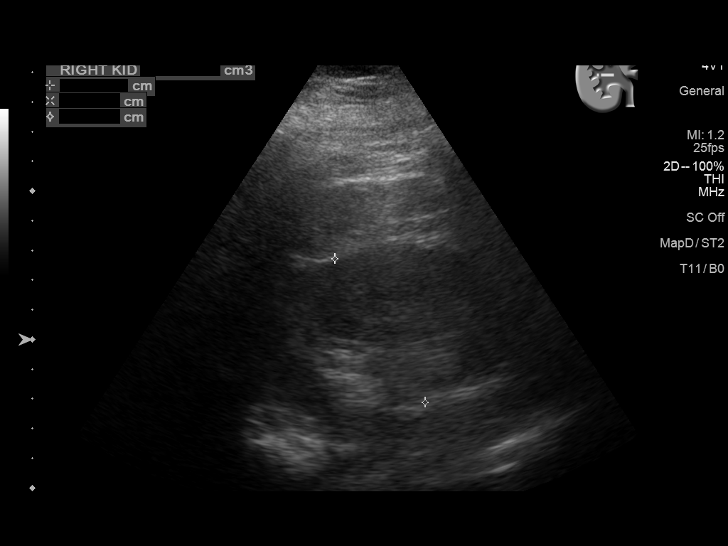
[im 13/35]
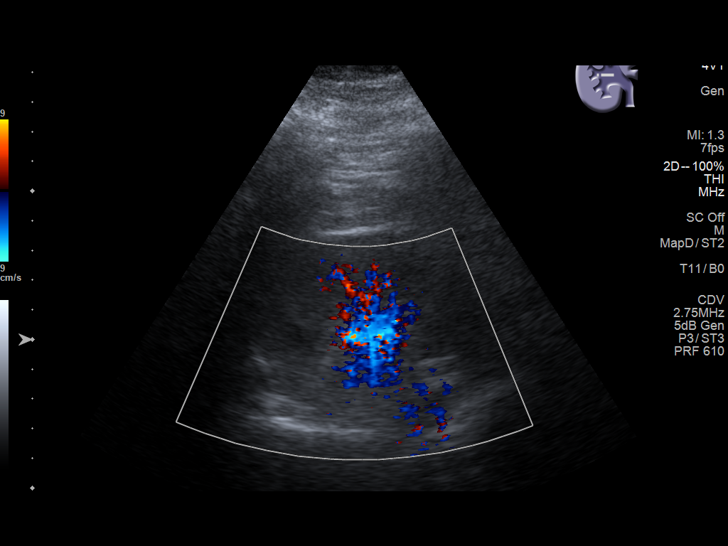
[im 16/35]
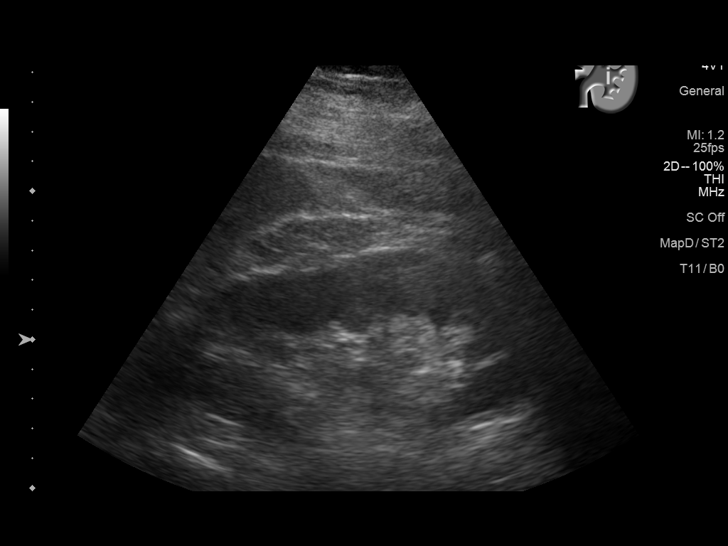
[im 19/35]
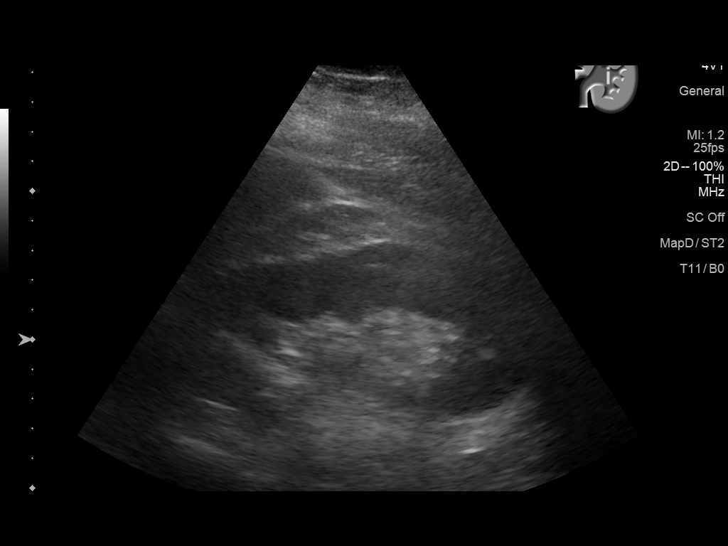
[im 22/35]
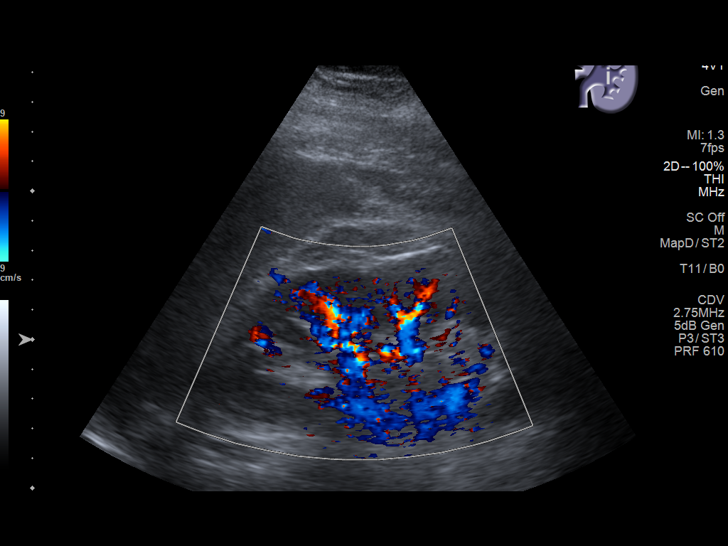
[im 23/35]
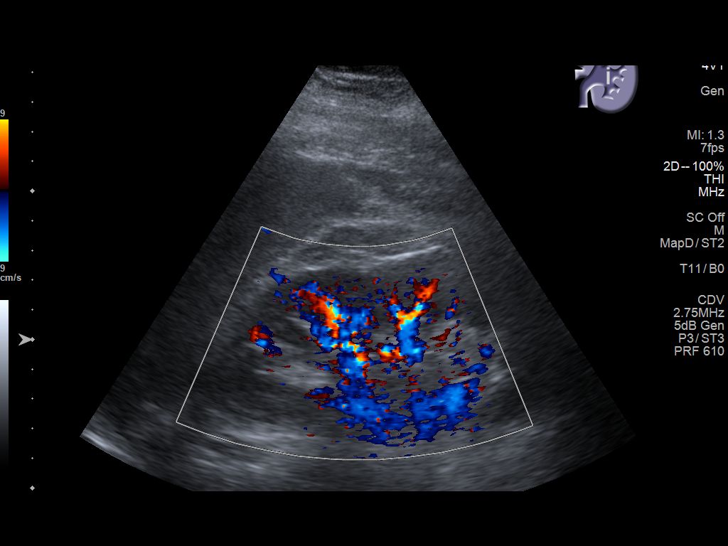
[im 26/35]
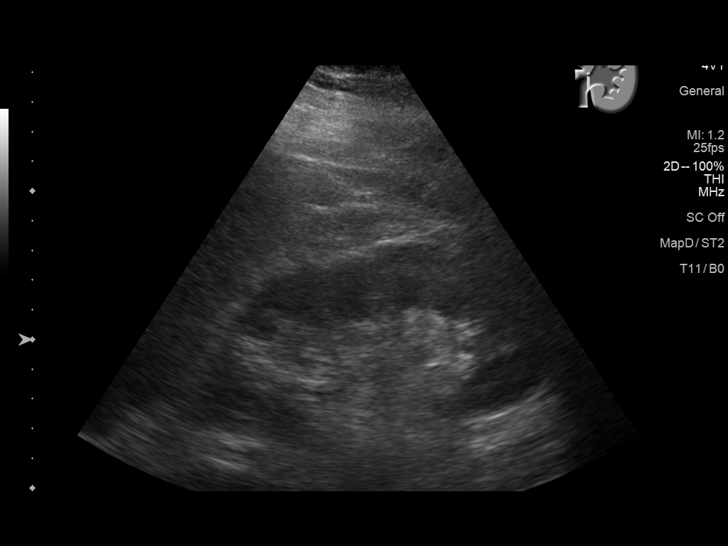
[im 29/35]
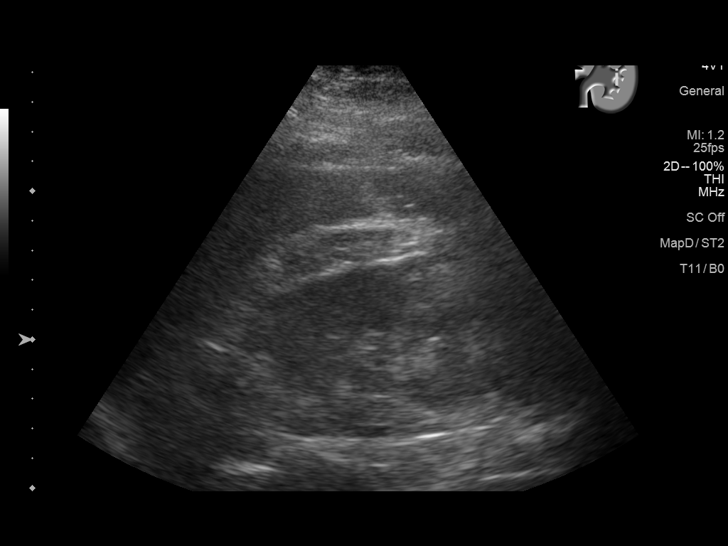
[im 32/35]
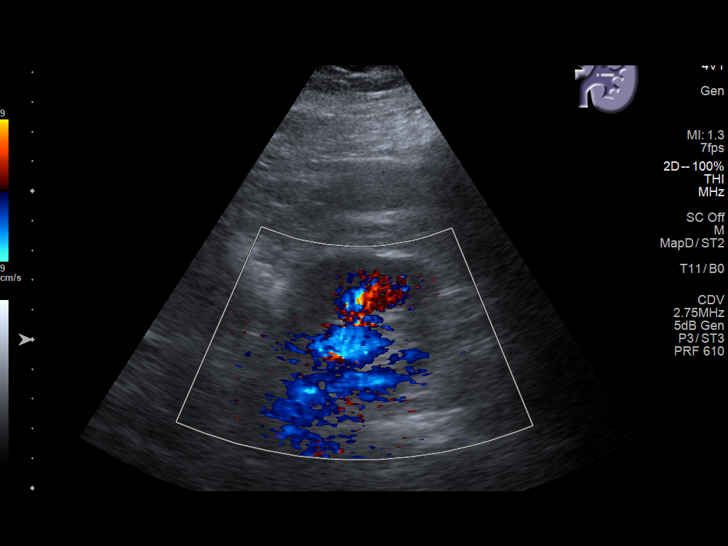
[im 35/35]
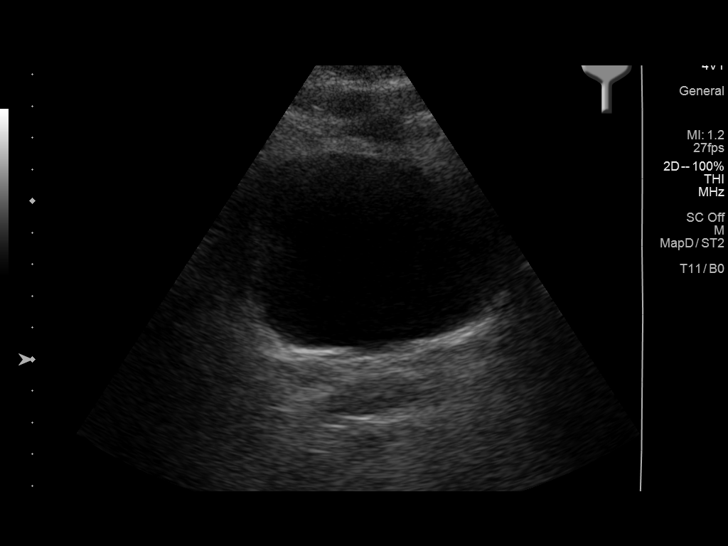

[14 of 25 positions shown; findings below may reference images not displayed]

FINDINGS: Right Kidney:

Renal measurements: 11.4 x 5.7 x 5.4 cm = volume: 183 mL.
Echogenicity within normal limits. No mass or hydronephrosis
visualized.

Left Kidney:

Renal measurements: 13.3 x 5.7 x 5.2 cm = volume: 203 mL.
Echogenicity within normal limits. No mass or hydronephrosis
visualized.

Bladder:

Appears normal for degree of bladder distention.

Other:

None.
IMPRESSION: Normal renal ultrasound.

## 2021-02-15 ENCOUNTER — Emergency Department
Admission: EM | Admit: 2021-02-15 | Discharge: 2021-02-15 | Disposition: A | Discharge status: Home / Self Care | Payer: BC Managed Care – PPO | Attending: Student in an Organized Health Care Education/Training Program | Admitting: Student in an Organized Health Care Education/Training Program

## 2021-02-15 ENCOUNTER — Other Ambulatory Visit: Payer: Self-pay

## 2021-02-15 ENCOUNTER — Emergency Department: Payer: BC Managed Care – PPO

## 2021-02-15 DIAGNOSIS — R109 Unspecified abdominal pain: Secondary | ICD-10-CM | POA: Diagnosis not present

## 2021-02-15 DIAGNOSIS — R110 Nausea: Secondary | ICD-10-CM | POA: Insufficient documentation

## 2021-02-15 LAB — URINALYSIS, COMPLETE (UACMP) WITH MICROSCOPIC
Bacteria, UA: NONE SEEN
Bilirubin Urine: NEGATIVE
Glucose, UA: NEGATIVE mg/dL
Hgb urine dipstick: NEGATIVE
Ketones, ur: NEGATIVE mg/dL
Leukocytes,Ua: NEGATIVE
Nitrite: NEGATIVE
Protein, ur: NEGATIVE mg/dL
Specific Gravity, Urine: 1.02 (ref 1.005–1.030)
Squamous Epithelial / HPF: NONE SEEN (ref 0–5)
pH: 7 (ref 5.0–8.0)

## 2021-02-15 LAB — COMPREHENSIVE METABOLIC PANEL
ALT: 85 U/L — ABNORMAL HIGH (ref 0–44)
AST: 37 U/L (ref 15–41)
Albumin: 4.4 g/dL (ref 3.5–5.0)
Alkaline Phosphatase: 75 U/L (ref 38–126)
Anion gap: 8 (ref 5–15)
BUN: 18 mg/dL (ref 6–20)
CO2: 26 mmol/L (ref 22–32)
Calcium: 9.3 mg/dL (ref 8.9–10.3)
Chloride: 106 mmol/L (ref 98–111)
Creatinine, Ser: 1.45 mg/dL — ABNORMAL HIGH (ref 0.61–1.24)
GFR, Estimated: >60 mL/min (ref >60)
Glucose, Bld: 104 mg/dL — ABNORMAL HIGH (ref 70–99)
Potassium: 4.2 mmol/L (ref 3.5–5.1)
Sodium: 140 mmol/L (ref 135–145)
Total Bilirubin: 0.9 mg/dL (ref 0.3–1.2)
Total Protein: 7.5 g/dL (ref 6.5–8.1)

## 2021-02-15 LAB — CBC
HCT: 41.4 % (ref 39.0–52.0)
Hemoglobin: 14.3 g/dL (ref 13.0–17.0)
MCH: 31.5 pg (ref 26.0–34.0)
MCHC: 34.5 g/dL (ref 30.0–36.0)
MCV: 91.2 fL (ref 80.0–100.0)
Platelets: 241 10*3/uL (ref 150–400)
RBC: 4.54 MIL/uL (ref 4.22–5.81)
RDW: 12.2 % (ref 11.5–15.5)
WBC: 9.9 10*3/uL (ref 4.0–10.5)
nRBC: 0 % (ref 0.0–0.2)

## 2021-02-15 MED ORDER — ONDANSETRON 4 MG PO TBDP: 4.0000 mg | ORAL_TABLET | Freq: Three times a day (TID) | ORAL | 0 refills | Status: DC | PRN

## 2021-02-15 MED ORDER — OXYCODONE-ACETAMINOPHEN 5-325 MG PO TABS: 1.0000 | ORAL_TABLET | ORAL | 0 refills | Status: DC | PRN

## 2021-02-15 MED ORDER — OXYCODONE-ACETAMINOPHEN 5-325 MG PO TABS
1.0000 | ORAL_TABLET | Freq: Once | ORAL | Status: AC
  Administered 2021-02-15: 1 via ORAL
  Filled 2021-02-15: qty 1

## 2021-02-15 MED ORDER — KETOROLAC TROMETHAMINE 30 MG/ML IJ SOLN
30.0000 mg | Freq: Once | INTRAMUSCULAR | Status: AC
  Administered 2021-02-15: 30 mg via INTRAMUSCULAR
  Filled 2021-02-15: qty 1

## 2021-02-15 NOTE — H&P (View-Only) (Signed)
02/16/2021 8:24 AM   Jeffery Everett 02/27/1996 [id number]  Referring provider: No referring provider defined for this encounter.  Urological history: 1.  Nephrolithiasis -Most recent incident was in June 2021 for ESWL -Ureteroscopy in February 2021  Chief Complaint  Patient presents with   Nephrolithiasis     HPI: Jeffery Everett is a 25 y.o. male who presents today for left ureteral stone.  He was seen in the ED on January 22, 2021 for left-sided flank pain associated with nausea.  CT renal stone study noted 4 mm left ureteral stone.  UA negative.  Urine culture negative.    He was discharged with Percocet, tamsulosin and Zofran.   He was again in the ED yesterday as he ran out of pain medication.  Subsequent KUB's have not been able identified.    He was lifting heavy objects at work when the pain started yesterday and that was sent him to the ED.   The pain was intermittent and eased with being still.    Patient denies any modifying or aggravating factors.  Patient denies any gross hematuria, dysuria or suprapubic/flank pain.  Patient denies any fevers, chills, nausea or vomiting.     PMH: Past Medical History:  Diagnosis Date   ADHD (attention deficit hyperactivity disorder)     Surgical History: Past Surgical History:  Procedure Laterality Date   CYSTOSCOPY/URETEROSCOPY/HOLMIUM LASER/STENT PLACEMENT Left 09/04/2019   Procedure: CYSTOSCOPY/URETEROSCOPY/HOLMIUM LASER/STENT PLACEMENT;  Surgeon: Jeffery Scotland, MD;  Location: [facility];  Service: Urology;  Laterality: Left;   EXTRACORPOREAL SHOCK WAVE LITHOTRIPSY Left 01/08/2020   Procedure: EXTRACORPOREAL SHOCK WAVE LITHOTRIPSY (ESWL);  Surgeon: Jeffery Come, MD;  Location: [facility];  Service: Urology;  Laterality: Left;   NO PAST SURGERIES     Verified with patient (11/25/15)    Home Medications:  Allergies as of 02/16/2021   No Known Allergies      Medication List        Accurate as of February 16, 2021 11:59 PM. If you have any questions, ask your nurse or doctor.          amphetamine-dextroamphetamine 20 MG 24 hr capsule Commonly known as: ADDERALL XR Take 1 capsule (20 mg total) by mouth every morning.   IBUPROFEN PO Take by mouth.   ondansetron 4 MG disintegrating tablet Commonly known as: Zofran ODT Take 1 tablet (4 mg total) by mouth every 8 (eight) hours as needed for nausea or vomiting.   oxyCODONE-acetaminophen 5-325 MG tablet Commonly known as: Percocet Take 1 tablet by mouth every 4 (four) hours as needed for severe pain.   tamsulosin 0.4 MG Caps capsule Commonly known as: FLOMAX Take 1 capsule (0.4 mg total) by mouth daily.        Allergies: No Known Allergies  Family History: Family History  Problem Relation Age of Onset   Hypertension Mother    ADD / ADHD Father    Hypertension Paternal Grandmother    Bipolar disorder Paternal Grandmother    Fibromyalgia Paternal Grandmother    COPD Paternal Grandmother    Heart disease Paternal Grandfather     Social History:  reports that he has never smoked. He has never used smokeless tobacco. He reports current drug use. Drug: Morphine. He reports that he does not drink alcohol.  ROS: Pertinent ROS in HPI  Physical Exam: BP 121/70   Pulse 82   Ht 6\' 3"  (1.905 m)   Wt (!) 320 lb (145.2 kg)   BMI 40.00 kg/m  Constitutional:  Well nourished. Alert and oriented, No acute distress. HEENT: [place] AT, mask in place  Trachea midline Cardiovascular: No clubbing, cyanosis, or edema. Respiratory: Normal respiratory effort, no increased work of breathing. Neurologic: Grossly intact, no focal deficits, moving all 4 extremities. Psychiatric: Normal mood and affect.   Laboratory Data: Results for orders placed or performed during the hospital encounter of 02/15/21  Comprehensive metabolic panel  Result Value Ref Range   Sodium 140 135 - 145 mmol/L   Potassium 4.2 3.5 - 5.1 mmol/L   Chloride 106 98 - 111  mmol/L   CO2 26 22 - 32 mmol/L   Glucose, Bld 104 (H) 70 - 99 mg/dL   BUN 18 6 - 20 mg/dL   Creatinine, Ser 1.77 (H) 0.61 - 1.24 mg/dL   Calcium 9.3 8.9 - 93.9 mg/dL   Total Protein 7.5 6.5 - 8.1 g/dL   Albumin 4.4 3.5 - 5.0 g/dL   AST 37 15 - 41 U/L   ALT 85 (H) 0 - 44 U/L   Alkaline Phosphatase 75 38 - 126 U/L   Total Bilirubin 0.9 0.3 - 1.2 mg/dL   GFR, Estimated >03 >00 mL/min   Anion gap 8 5 - 15  CBC  Result Value Ref Range   WBC 9.9 4.0 - 10.5 K/uL   RBC 4.54 4.22 - 5.81 MIL/uL   Hemoglobin 14.3 13.0 - 17.0 g/dL   HCT 92.3 30.0 - 76.2 %   MCV 91.2 80.0 - 100.0 fL   MCH 31.5 26.0 - 34.0 pg   MCHC 34.5 30.0 - 36.0 g/dL   RDW 26.3 33.5 - 45.6 %   Platelets 241 150 - 400 K/uL   nRBC 0.0 0.0 - 0.2 %  Urinalysis, Complete w Microscopic  Result Value Ref Range   Color, Urine YELLOW (A) YELLOW   APPearance HAZY (A) CLEAR   Specific Gravity, Urine 1.020 1.005 - 1.030   pH 7.0 5.0 - 8.0   Glucose, UA NEGATIVE NEGATIVE mg/dL   Hgb urine dipstick NEGATIVE NEGATIVE   Bilirubin Urine NEGATIVE NEGATIVE   Ketones, ur NEGATIVE NEGATIVE mg/dL   Protein, ur NEGATIVE NEGATIVE mg/dL   Nitrite NEGATIVE NEGATIVE   Leukocytes,Ua NEGATIVE NEGATIVE   RBC / HPF 0-5 0 - 5 RBC/hpf   WBC, UA 0-5 0 - 5 WBC/hpf   Bacteria, UA NONE SEEN NONE SEEN   Squamous Epithelial / LPF NONE SEEN 0 - 5   Mucus PRESENT    I have reviewed the labs.   Pertinent Imaging CLINICAL DATA:  Left flank pain, history of nephrolithiasis   EXAM: ABDOMEN - 1 VIEW   COMPARISON:  This 02/02/2021   FINDINGS: Right renal shadows obscured by stool and gas pattern. No large radiopaque urinary tract calculi appreciated. Nonobstructive bowel gas pattern. No acute osseous finding.   IMPRESSION: No significant urinary tract calculi by plain radiography.     Electronically Signed   By: Jeffery Everett.  Shick M.D.   On: 02/15/2021 08:56 I have independently reviewed the films.  See HPI.   Assessment & Plan:    1. Left  ureteral stone -Jeffery Everett is still not able to be visualized on KUB -We will repeat CT renal stone study as patient continues to have left-sided flank pain and negative UAs to confirm the stone is still present -If stone is still present he wants to proceed with ureteroscopy - explained to the patient how the procedure is performed and the risks involved - informed patient that they will  have a stent placed during the procedure and will remain in place after the procedure for a short time.  - stent may be removed in the office with a cystoscope or patient may be instructed to remove the stent themselves by the string - described "stent pain" as feelings of needing to urinate/overactive bladder and a warm, tingling sensation to intense pain in the affected flank - residual stones within the kidney or ureter may be present after the procedure and may need to have these addressed at a different encounter - injury to the ureter is the most common intra-operative risk, it may result in an open procedure to correct the defect - infection and bleeding are also risks - explained the risks of general anesthesia, such as: MI, CVA, paralysis, coma and/or death. - advised to contact our office or seek treatment in the ED if becomes febrile or pain/ vomiting are difficult control in order to arrange for emergent/urgent intervention   Return for Pending CT Renal stone study results.  These notes generated with voice recognition software. I apologize for typographical errors.  Michiel Cowboy, PA-C  [facility] [address]  [address] [place], [place] [address] [phone]  Addendum: CT renal stone study dated February 18, 2021 demonstrates that the stone still remains in the mid pelvic portion of the left ureter and is unchanged since the previous CT scan along with a 2 mm left renal calculus.  The results are explained to the patient and he wishes to proceed with ureteroscopy as  soon as possible.

## 2021-02-15 NOTE — ED Provider Notes (Signed)
[facility] Emergency Department Provider Note    Event Date/Time   First MD Initiated Contact with Patient 02/15/21 [phone]     (approximate)  I have reviewed the triage vital signs and the nursing notes.   HISTORY  Chief Complaint Flank Pain    HPI Glennie Rodda is a 25 y.o. male with a history of kidney stones presents to the ER for recurrent left flank pain associate with nausea.  Is being followed for 4 mm stone that is not passed yet.  Attempting conservative management 30 has required lithotripsy in the past.  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  Denies any fevers.  No dysuria.  Past Medical History:  Diagnosis Date   ADHD (attention deficit hyperactivity disorder)    Family History  Problem Relation Age of Onset   Hypertension Mother    ADD / ADHD Father    Hypertension Paternal Grandmother    Bipolar disorder Paternal Grandmother    Fibromyalgia Paternal Grandmother    COPD Paternal Grandmother    Heart disease Paternal Grandfather    Past Surgical History:  Procedure Laterality Date   CYSTOSCOPY/URETEROSCOPY/HOLMIUM LASER/STENT PLACEMENT Left 09/04/2019   Procedure: CYSTOSCOPY/URETEROSCOPY/HOLMIUM LASER/STENT PLACEMENT;  Surgeon: [name], MD;  Location: [facility];  Service: Urology;  Laterality: Left;   EXTRACORPOREAL SHOCK WAVE LITHOTRIPSY Left 01/08/2020   Procedure: EXTRACORPOREAL SHOCK WAVE LITHOTRIPSY (ESWL);  Surgeon: Sondra Come, MD;  Location: [facility] OR[facility];  Service: Urology;  Laterality: Left;   NO PAST SURGERIES     Verified with patient (23/4/17)   Patient Active Problem List   Diagnosis Date Noted   Vitamin D deficiency 05/03/2020   Left ureteral stone 10/29/2019   Attention deficit hyperactivity disorder (ADHD) 06/18/2017   Obesity, Class II, 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 Authorizing Provider  oxyCODONE-acetaminophen (PERCOCET) 5-325 MG tablet Take 1 tablet by mouth every 4 (four) hours as needed for severe pain. 02/15/21 02/15/22 Yes Willy Eddy, MD  amphetamine-dextroamphetamine (ADDERALL XR) 20 MG 24 hr capsule Take 1 capsule (20 mg total) by mouth every morning. 05/03/20   Emi Belfast, FNP  IBUPROFEN PO Take by mouth.    [provider]  ondansetron (ZOFRAN ODT) 4 MG disintegrating tablet Take 1 tablet (4 mg total) by mouth every 8 (eight) hours as needed for nausea or vomiting. 02/15/21   Willy Eddy, MD  tamsulosin (FLOMAX) 0.4 MG CAPS capsule Take 1 capsule (0.4 mg total) by mouth daily. 02/02/21 03/04/21  Michiel Cowboy A, PA-C    Allergies Patient has no known allergies.    Social History Social History   Tobacco Use   Smoking status: Never   Smokeless tobacco: Never  Substance Use Topics   Alcohol use: No   Drug use: Yes    Types: Morphine    Review of Systems Patient denies headaches, rhinorrhea, blurry vision, numbness, shortness of breath, chest pain, edema, cough, abdominal pain, nausea, vomiting, diarrhea, dysuria, fevers, rashes or hallucinations unless otherwise stated above in HPI. ____________________________________________   PHYSICAL EXAM:  VITAL SIGNS: Vitals:   02/15/21 0231  BP: 130/86  Pulse: 78  Resp: 18  Temp: 98.6 F (37 C)  SpO2: 99%    Constitutional: Alert and oriented.  Eyes: Conjunctivae are normal.  Head: Atraumatic. Nose: No congestion/rhinnorhea. Mouth/Throat: Mucous membranes are moist.  Neck: No stridor. Painless ROM.  Cardiovascular: Normal rate, regular rhythm. Grossly normal heart sounds.  Good peripheral circulation. Respiratory: Normal respiratory effort.  No retractions. Lungs CTAB. Gastrointestinal: Soft and nontender. No distention. No abdominal bruits. Mild left CVA tenderness. Genitourinary:  Musculoskeletal: No lower extremity tenderness nor edema.   No joint effusions. Neurologic:  Normal speech and language. No gross focal neurologic deficits are appreciated. No facial droop Skin:  Skin is warm, dry and intact. No rash noted. Psychiatric: Mood and affect are normal. Speech and behavior are normal.  ____________________________________________   LABS (all labs ordered are listed, but only abnormal results are displayed)  Results for orders placed or performed during the hospital encounter of 02/15/21 (from the past 24 hour(s))  Comprehensive metabolic panel     Status: Abnormal   Collection Time: 02/15/21  2:35 AM  Result Value Ref Range   Sodium 140 135 - 145 mmol/L   Potassium 4.2 3.5 - 5.1 mmol/L   Chloride 106 98 - 111 mmol/L   CO2 26 22 - 32 mmol/L   Glucose, Bld 104 (H) 70 - 99 mg/dL   BUN 18 6 - 20 mg/dL   Creatinine, Ser 9.52 (H) 0.61 - 1.24 mg/dL   Calcium 9.3 8.9 - 84.1 mg/dL   Total Protein 7.5 6.5 - 8.1 g/dL   Albumin 4.4 3.5 - 5.0 g/dL   AST 37 15 - 41 U/L   ALT 85 (H) 0 - 44 U/L   Alkaline Phosphatase 75 38 - 126 U/L   Total Bilirubin 0.9 0.3 - 1.2 mg/dL   GFR, Estimated >32 >44 mL/min   Anion gap 8 5 - 15  CBC     Status: None   Collection Time: 02/15/21  2:35 AM  Result Value Ref Range   WBC 9.9 4.0 - 10.5 K/uL   RBC 4.54 4.22 - 5.81 MIL/uL   Hemoglobin 14.3 13.0 - 17.0 g/dL   HCT 01.0 27.2 - 53.6 %   MCV 91.2 80.0 - 100.0 fL   MCH 31.5 26.0 - 34.0 pg   MCHC 34.5 30.0 - 36.0 g/dL   RDW 64.4 03.4 - 74.2 %   Platelets 241 150 - 400 K/uL   nRBC 0.0 0.0 - 0.2 %  Urinalysis, Complete w Microscopic     Status: Abnormal   Collection Time: 02/15/21  5:06 AM  Result Value Ref Range   Color, Urine YELLOW (A) YELLOW   APPearance HAZY (A) CLEAR   Specific Gravity, Urine 1.020 1.005 - 1.030   pH 7.0 5.0 - 8.0   Glucose, UA NEGATIVE NEGATIVE mg/dL   Hgb urine dipstick NEGATIVE NEGATIVE   Bilirubin Urine NEGATIVE NEGATIVE   Ketones, ur NEGATIVE NEGATIVE mg/dL   Protein, ur NEGATIVE NEGATIVE mg/dL   Nitrite  NEGATIVE NEGATIVE   Leukocytes,Ua NEGATIVE NEGATIVE   RBC / HPF 0-5 0 - 5 RBC/hpf   WBC, UA 0-5 0 - 5 WBC/hpf   Bacteria, UA NONE SEEN NONE SEEN   Squamous Epithelial / LPF NONE SEEN 0 - 5   Mucus PRESENT    ____________________________________________ ____________________________________________  RADIOLOGY  I personally reviewed all radiographic images ordered to evaluate for the above acute complaints and reviewed radiology reports and findings.  These findings were personally discussed with the patient.  Please see medical record for radiology report.  ____________________________________________   PROCEDURES  Procedure(s) performed:  Procedures    Critical Care performed: no ____________________________________________   INITIAL IMPRESSION / ASSESSMENT AND PLAN / ED COURSE  Pertinent labs & imaging results that were available during my care of the patient were reviewed by me and considered in my medical decision making (see chart for details).   DDX: stone, pyelo, msk strain  Zigmund Linse is a 25 y.o. who presents to the ED with left flank pain in the setting of known kidney stone here for persistent pain.  No fevers hemodynamically stable.  No SIRS criteria.  KUB repeated.  Patient given IM Toradol as well as oral pain medication.  He has follow-up with urology tomorrow.  Will give refill on antiemetics as well as pain medication.  The patient will be placed on continuous pulse oximetry and telemetry for monitoring.  Laboratory evaluation will be sent to evaluate for the above complaints.        The patient was evaluated in Emergency Department today for the symptoms described in the history of present illness. He/she was evaluated in the context of the global COVID-19 pandemic, which necessitated consideration that the patient might be at risk for infection with the SARS-CoV-2 virus that causes COVID-19. Institutional protocols and algorithms that pertain to the  evaluation of patients at risk for COVID-19 are in a state of rapid change based on information released by regulatory bodies including the CDC and federal and state organizations. These policies and algorithms were followed during the patient's care in the ED.  As part of my medical decision making, I reviewed the following data within the electronic MEDICAL RECORD NUMBER Nursing notes reviewed and incorporated, Labs reviewed, notes from prior ED visits and  Controlled Substance Database   ____________________________________________   FINAL CLINICAL IMPRESSION(S) / ED DIAGNOSES  Final diagnoses:  Left flank pain      NEW MEDICATIONS STARTED DURING THIS VISIT:  New Prescriptions   OXYCODONE-ACETAMINOPHEN (PERCOCET) 5-325 MG TABLET    Take 1 tablet by mouth every 4 (four) hours as needed for severe pain.     Note:  This document was prepared using Dragon voice recognition software and may include unintentional dictation errors.    Willy Eddy, MD 02/15/21 [phone]

## 2021-02-15 NOTE — ED Triage Notes (Signed)
Pt has left flank pain.  No n/v/d.  Hx kidney stones.  Pt out of pain meds since last night.  Pt alert  speech clear.

## 2021-02-15 NOTE — H&P (View-Only) (Signed)
02/16/2021 8:24 AM   Jeffery Everett 02/27/1996 [id number]  Referring provider: No referring provider defined for this encounter.  Urological history: 1.  Nephrolithiasis -Most recent incident was in June 2021 for ESWL -Ureteroscopy in February 2021  Chief Complaint  Patient presents with   Nephrolithiasis     HPI: Jeffery Everett is a 25 y.o. male who presents today for left ureteral stone.  He was seen in the ED on January 22, 2021 for left-sided flank pain associated with nausea.  CT renal stone study noted 4 mm left ureteral stone.  UA negative.  Urine culture negative.    He was discharged with Percocet, tamsulosin and Zofran.   He was again in the ED yesterday as he ran out of pain medication.  Subsequent KUB's have not been able identified.    He was lifting heavy objects at work when the pain started yesterday and that was sent him to the ED.   The pain was intermittent and eased with being still.    Patient denies any modifying or aggravating factors.  Patient denies any gross hematuria, dysuria or suprapubic/flank pain.  Patient denies any fevers, chills, nausea or vomiting.     PMH: Past Medical History:  Diagnosis Date   ADHD (attention deficit hyperactivity disorder)     Surgical History: Past Surgical History:  Procedure Laterality Date   CYSTOSCOPY/URETEROSCOPY/HOLMIUM LASER/STENT PLACEMENT Left 09/04/2019   Procedure: CYSTOSCOPY/URETEROSCOPY/HOLMIUM LASER/STENT PLACEMENT;  Surgeon: [name], MD;  Location: [facility];  Service: Urology;  Laterality: Left;   EXTRACORPOREAL SHOCK WAVE LITHOTRIPSY Left 01/08/2020   Procedure: EXTRACORPOREAL SHOCK WAVE LITHOTRIPSY (ESWL);  Surgeon: Sondra Come, MD;  Location: [facility]S;  Service: Urology;  Laterality: Left;   NO PAST SURGERIES     Verified with patient (11/25/15)    Home Medications:  Allergies as of 02/16/2021   No Known Allergies      Medication List        Accurate as of February 16, 2021 11:59 PM. If you have any questions, ask your nurse or doctor.          amphetamine-dextroamphetamine 20 MG 24 hr capsule Commonly known as: ADDERALL XR Take 1 capsule (20 mg total) by mouth every morning.   IBUPROFEN PO Take by mouth.   ondansetron 4 MG disintegrating tablet Commonly known as: Zofran ODT Take 1 tablet (4 mg total) by mouth every 8 (eight) hours as needed for nausea or vomiting.   oxyCODONE-acetaminophen 5-325 MG tablet Commonly known as: Percocet Take 1 tablet by mouth every 4 (four) hours as needed for severe pain.   tamsulosin 0.4 MG Caps capsule Commonly known as: FLOMAX Take 1 capsule (0.4 mg total) by mouth daily.        Allergies: No Known Allergies  Family History: Family History  Problem Relation Age of Onset   Hypertension Mother    ADD / ADHD Father    Hypertension Paternal Grandmother    Bipolar disorder Paternal Grandmother    Fibromyalgia Paternal Grandmother    COPD Paternal Grandmother    Heart disease Paternal Grandfather     Social History:  reports that he has never smoked. He has never used smokeless tobacco. He reports current drug use. Drug: Morphine. He reports that he does not drink alcohol.  ROS: Pertinent ROS in HPI  Physical Exam: BP 121/70   Pulse 82   Ht 6\' 3"  (1.905 m)   Wt (!) 320 lb (145.2 kg)   BMI 40.00 kg/m  Constitutional:  Well nourished. Alert and oriented, No acute distress. HEENT: [place] AT, mask in place  Trachea midline Cardiovascular: No clubbing, cyanosis, or edema. Respiratory: Normal respiratory effort, no increased work of breathing. Neurologic: Grossly intact, no focal deficits, moving all 4 extremities. Psychiatric: Normal mood and affect.   Laboratory Data: Results for orders placed or performed during the hospital encounter of 02/15/21  Comprehensive metabolic panel  Result Value Ref Range   Sodium 140 135 - 145 mmol/L   Potassium 4.2 3.5 - 5.1 mmol/L   Chloride 106 98 - 111  mmol/L   CO2 26 22 - 32 mmol/L   Glucose, Bld 104 (H) 70 - 99 mg/dL   BUN 18 6 - 20 mg/dL   Creatinine, Ser 1.77 (H) 0.61 - 1.24 mg/dL   Calcium 9.3 8.9 - 93.9 mg/dL   Total Protein 7.5 6.5 - 8.1 g/dL   Albumin 4.4 3.5 - 5.0 g/dL   AST 37 15 - 41 U/L   ALT 85 (H) 0 - 44 U/L   Alkaline Phosphatase 75 38 - 126 U/L   Total Bilirubin 0.9 0.3 - 1.2 mg/dL   GFR, Estimated >03 >00 mL/min   Anion gap 8 5 - 15  CBC  Result Value Ref Range   WBC 9.9 4.0 - 10.5 K/uL   RBC 4.54 4.22 - 5.81 MIL/uL   Hemoglobin 14.3 13.0 - 17.0 g/dL   HCT 92.3 30.0 - 76.2 %   MCV 91.2 80.0 - 100.0 fL   MCH 31.5 26.0 - 34.0 pg   MCHC 34.5 30.0 - 36.0 g/dL   RDW 26.3 33.5 - 45.6 %   Platelets 241 150 - 400 K/uL   nRBC 0.0 0.0 - 0.2 %  Urinalysis, Complete w Microscopic  Result Value Ref Range   Color, Urine YELLOW (A) YELLOW   APPearance HAZY (A) CLEAR   Specific Gravity, Urine 1.020 1.005 - 1.030   pH 7.0 5.0 - 8.0   Glucose, UA NEGATIVE NEGATIVE mg/dL   Hgb urine dipstick NEGATIVE NEGATIVE   Bilirubin Urine NEGATIVE NEGATIVE   Ketones, ur NEGATIVE NEGATIVE mg/dL   Protein, ur NEGATIVE NEGATIVE mg/dL   Nitrite NEGATIVE NEGATIVE   Leukocytes,Ua NEGATIVE NEGATIVE   RBC / HPF 0-5 0 - 5 RBC/hpf   WBC, UA 0-5 0 - 5 WBC/hpf   Bacteria, UA NONE SEEN NONE SEEN   Squamous Epithelial / LPF NONE SEEN 0 - 5   Mucus PRESENT    I have reviewed the labs.   Pertinent Imaging CLINICAL DATA:  Left flank pain, history of nephrolithiasis   EXAM: ABDOMEN - 1 VIEW   COMPARISON:  This 02/02/2021   FINDINGS: Right renal shadows obscured by stool and gas pattern. No large radiopaque urinary tract calculi appreciated. Nonobstructive bowel gas pattern. No acute osseous finding.   IMPRESSION: No significant urinary tract calculi by plain radiography.     Electronically Signed   By: Judie Petit.  Shick M.D.   On: 02/15/2021 08:56 I have independently reviewed the films.  See HPI.   Assessment & Plan:    1. Left  ureteral stone -[name] is still not able to be visualized on KUB -We will repeat CT renal stone study as patient continues to have left-sided flank pain and negative UAs to confirm the stone is still present -If stone is still present he wants to proceed with ureteroscopy - explained to the patient how the procedure is performed and the risks involved - informed patient that they will  have a stent placed during the procedure and will remain in place after the procedure for a short time.  - stent may be removed in the office with a cystoscope or patient may be instructed to remove the stent themselves by the string - described "stent pain" as feelings of needing to urinate/overactive bladder and a warm, tingling sensation to intense pain in the affected flank - residual stones within the kidney or ureter may be present after the procedure and may need to have these addressed at a different encounter - injury to the ureter is the most common intra-operative risk, it may result in an open procedure to correct the defect - infection and bleeding are also risks - explained the risks of general anesthesia, such as: MI, CVA, paralysis, coma and/or death. - advised to contact our office or seek treatment in the ED if becomes febrile or pain/ vomiting are difficult control in order to arrange for emergent/urgent intervention   Return for Pending CT Renal stone study results.  These notes generated with voice recognition software. I apologize for typographical errors.  Michiel Cowboy, PA-C  [facility] [address]  [address] [place], [place] [address] [phone]  Addendum: CT renal stone study dated February 18, 2021 demonstrates that the stone still remains in the mid pelvic portion of the left ureter and is unchanged since the previous CT scan along with a 2 mm left renal calculus.  The results are explained to the patient and he wishes to proceed with ureteroscopy as  soon as possible.

## 2021-02-15 NOTE — Progress Notes (Signed)
02/16/2021 8:24 AM   Jeffery Everett 02/27/1996 [id number]  Referring provider: No referring provider defined for this encounter.  Urological history: 1.  Nephrolithiasis -Most recent incident was in June 2021 for ESWL -Ureteroscopy in February 2021  Chief Complaint  Patient presents with   Nephrolithiasis     HPI: Jeffery Everett is a 25 y.o. male who presents today for left ureteral stone.  He was seen in the ED on January 22, 2021 for left-sided flank pain associated with nausea.  CT renal stone study noted 4 mm left ureteral stone.  UA negative.  Urine culture negative.    He was discharged with Percocet, tamsulosin and Zofran.   He was again in the ED yesterday as he ran out of pain medication.  Subsequent KUB's have not been able identified.    He was lifting heavy objects at work when the pain started yesterday and that was sent him to the ED.   The pain was intermittent and eased with being still.    Patient denies any modifying or aggravating factors.  Patient denies any gross hematuria, dysuria or suprapubic/flank pain.  Patient denies any fevers, chills, nausea or vomiting.     PMH: Past Medical History:  Diagnosis Date   ADHD (attention deficit hyperactivity disorder)     Surgical History: Past Surgical History:  Procedure Laterality Date   CYSTOSCOPY/URETEROSCOPY/HOLMIUM LASER/STENT PLACEMENT Left 09/04/2019   Procedure: CYSTOSCOPY/URETEROSCOPY/HOLMIUM LASER/STENT PLACEMENT;  Surgeon: [name], MD;  Location: [facility];  Service: Urology;  Laterality: Left;   EXTRACORPOREAL SHOCK WAVE LITHOTRIPSY Left 01/08/2020   Procedure: EXTRACORPOREAL SHOCK WAVE LITHOTRIPSY (ESWL);  Surgeon: Sondra Come, MD;  Location: [facility];  Service: Urology;  Laterality: Left;   NO PAST SURGERIES     Verified with patient (11/25/15)    Home Medications:  Allergies as of 02/16/2021   No Known Allergies      Medication List        Accurate as of February 16, 2021 11:59 PM. If you have any questions, ask your nurse or doctor.          amphetamine-dextroamphetamine 20 MG 24 hr capsule Commonly known as: ADDERALL XR Take 1 capsule (20 mg total) by mouth every morning.   IBUPROFEN PO Take by mouth.   ondansetron 4 MG disintegrating tablet Commonly known as: Zofran ODT Take 1 tablet (4 mg total) by mouth every 8 (eight) hours as needed for nausea or vomiting.   oxyCODONE-acetaminophen 5-325 MG tablet Commonly known as: Percocet Take 1 tablet by mouth every 4 (four) hours as needed for severe pain.   tamsulosin 0.4 MG Caps capsule Commonly known as: FLOMAX Take 1 capsule (0.4 mg total) by mouth daily.        Allergies: No Known Allergies  Family History: Family History  Problem Relation Age of Onset   Hypertension Mother    ADD / ADHD Father    Hypertension Paternal Grandmother    Bipolar disorder Paternal Grandmother    Fibromyalgia Paternal Grandmother    COPD Paternal Grandmother    Heart disease Paternal Grandfather     Social History:  reports that he has never smoked. He has never used smokeless tobacco. He reports current drug use. Drug: Morphine. He reports that he does not drink alcohol.  ROS: Pertinent ROS in HPI  Physical Exam: BP 121/70   Pulse 82   Ht 6\' 3"  (1.905 m)   Wt (!) 320 lb (145.2 kg)   BMI 40.00 kg/m  Constitutional:  Well nourished. Alert and oriented, No acute distress. HEENT: [place] AT, mask in place  Trachea midline Cardiovascular: No clubbing, cyanosis, or edema. Respiratory: Normal respiratory effort, no increased work of breathing. Neurologic: Grossly intact, no focal deficits, moving all 4 extremities. Psychiatric: Normal mood and affect.   Laboratory Data: Results for orders placed or performed during the hospital encounter of 02/15/21  Comprehensive metabolic panel  Result Value Ref Range   Sodium 140 135 - 145 mmol/L   Potassium 4.2 3.5 - 5.1 mmol/L   Chloride 106 98 - 111  mmol/L   CO2 26 22 - 32 mmol/L   Glucose, Bld 104 (H) 70 - 99 mg/dL   BUN 18 6 - 20 mg/dL   Creatinine, Ser 1.77 (H) 0.61 - 1.24 mg/dL   Calcium 9.3 8.9 - 93.9 mg/dL   Total Protein 7.5 6.5 - 8.1 g/dL   Albumin 4.4 3.5 - 5.0 g/dL   AST 37 15 - 41 U/L   ALT 85 (H) 0 - 44 U/L   Alkaline Phosphatase 75 38 - 126 U/L   Total Bilirubin 0.9 0.3 - 1.2 mg/dL   GFR, Estimated >03 >00 mL/min   Anion gap 8 5 - 15  CBC  Result Value Ref Range   WBC 9.9 4.0 - 10.5 K/uL   RBC 4.54 4.22 - 5.81 MIL/uL   Hemoglobin 14.3 13.0 - 17.0 g/dL   HCT 92.3 30.0 - 76.2 %   MCV 91.2 80.0 - 100.0 fL   MCH 31.5 26.0 - 34.0 pg   MCHC 34.5 30.0 - 36.0 g/dL   RDW 26.3 33.5 - 45.6 %   Platelets 241 150 - 400 K/uL   nRBC 0.0 0.0 - 0.2 %  Urinalysis, Complete w Microscopic  Result Value Ref Range   Color, Urine YELLOW (A) YELLOW   APPearance HAZY (A) CLEAR   Specific Gravity, Urine 1.020 1.005 - 1.030   pH 7.0 5.0 - 8.0   Glucose, UA NEGATIVE NEGATIVE mg/dL   Hgb urine dipstick NEGATIVE NEGATIVE   Bilirubin Urine NEGATIVE NEGATIVE   Ketones, ur NEGATIVE NEGATIVE mg/dL   Protein, ur NEGATIVE NEGATIVE mg/dL   Nitrite NEGATIVE NEGATIVE   Leukocytes,Ua NEGATIVE NEGATIVE   RBC / HPF 0-5 0 - 5 RBC/hpf   WBC, UA 0-5 0 - 5 WBC/hpf   Bacteria, UA NONE SEEN NONE SEEN   Squamous Epithelial / LPF NONE SEEN 0 - 5   Mucus PRESENT    I have reviewed the labs.   Pertinent Imaging CLINICAL DATA:  Left flank pain, history of nephrolithiasis   EXAM: ABDOMEN - 1 VIEW   COMPARISON:  This 02/02/2021   FINDINGS: Right renal shadows obscured by stool and gas pattern. No large radiopaque urinary tract calculi appreciated. Nonobstructive bowel gas pattern. No acute osseous finding.   IMPRESSION: No significant urinary tract calculi by plain radiography.     Electronically Signed   By: Judie Petit.  Shick M.D.   On: 02/15/2021 08:56 I have independently reviewed the films.  See HPI.   Assessment & Plan:    1. Left  ureteral stone -[name] is still not able to be visualized on KUB -We will repeat CT renal stone study as patient continues to have left-sided flank pain and negative UAs to confirm the stone is still present -If stone is still present he wants to proceed with ureteroscopy - explained to the patient how the procedure is performed and the risks involved - informed patient that they will  have a stent placed during the procedure and will remain in place after the procedure for a short time.  - stent may be removed in the office with a cystoscope or patient may be instructed to remove the stent themselves by the string - described "stent pain" as feelings of needing to urinate/overactive bladder and a warm, tingling sensation to intense pain in the affected flank - residual stones within the kidney or ureter may be present after the procedure and may need to have these addressed at a different encounter - injury to the ureter is the most common intra-operative risk, it may result in an open procedure to correct the defect - infection and bleeding are also risks - explained the risks of general anesthesia, such as: MI, CVA, paralysis, coma and/or death. - advised to contact our office or seek treatment in the ED if becomes febrile or pain/ vomiting are difficult control in order to arrange for emergent/urgent intervention   Return for Pending CT Renal stone study results.  These notes generated with voice recognition software. I apologize for typographical errors.  Michiel Cowboy, PA-C  [facility] [address]  [address] [place], [place] [address] [phone]  Addendum: CT renal stone study dated February 18, 2021 demonstrates that the stone still remains in the mid pelvic portion of the left ureter and is unchanged since the previous CT scan along with a 2 mm left renal calculus.  The results are explained to the patient and he wishes to proceed with ureteroscopy as  soon as possible.

## 2021-02-16 ENCOUNTER — Encounter: Payer: Self-pay | Admitting: Urology

## 2021-02-16 ENCOUNTER — Ambulatory Visit (INDEPENDENT_AMBULATORY_CARE_PROVIDER_SITE_OTHER): Payer: BC Managed Care – PPO | Admitting: Urology

## 2021-02-16 VITALS — BP 121/70 | HR 82 | Ht 75.0 in | Wt 320.0 lb

## 2021-02-16 DIAGNOSIS — N200 Calculus of kidney: Secondary | ICD-10-CM | POA: Diagnosis not present

## 2021-02-18 ENCOUNTER — Ambulatory Visit
Admission: RE | Admit: 2021-02-18 | Discharge: 2021-02-18 | Disposition: A | Discharge status: Home / Self Care | Payer: BC Managed Care – PPO | Source: Ambulatory Visit | Attending: Urology | Admitting: Urology

## 2021-02-18 ENCOUNTER — Other Ambulatory Visit: Payer: Self-pay

## 2021-02-18 DIAGNOSIS — N200 Calculus of kidney: Secondary | ICD-10-CM

## 2021-02-22 ENCOUNTER — Telehealth: Payer: Self-pay

## 2021-02-22 ENCOUNTER — Other Ambulatory Visit: Payer: Self-pay

## 2021-02-22 DIAGNOSIS — N201 Calculus of ureter: Secondary | ICD-10-CM

## 2021-02-22 DIAGNOSIS — N1330 Unspecified hydronephrosis: Secondary | ICD-10-CM

## 2021-02-22 NOTE — Telephone Encounter (Signed)
Patient called office to schedule URS per Michiel Cowboy, PAC. Agreed on surgery date of 02/25/21 w/Dr. Apolinar Junes for Left URS w/LL & Stent placement. Patient will be NPO from midnight on and arrive two hours early to [facility]. Patient was given number to call the day prior to surgery to confirm time. Patient verbalized understanding

## 2021-02-24 ENCOUNTER — Encounter
Admission: RE | Admit: 2021-02-24 | Discharge: 2021-02-24 | Disposition: A | Discharge status: Home / Self Care | Payer: BC Managed Care – PPO | Source: Ambulatory Visit | Attending: Urology | Admitting: Urology

## 2021-02-24 ENCOUNTER — Other Ambulatory Visit: Payer: Self-pay | Admitting: Urology

## 2021-02-24 ENCOUNTER — Other Ambulatory Visit: Payer: Self-pay

## 2021-02-24 DIAGNOSIS — N200 Calculus of kidney: Secondary | ICD-10-CM

## 2021-02-24 HISTORY — DX: Personal history of urinary calculi: Z87.442

## 2021-02-24 MED ORDER — CHLORHEXIDINE GLUCONATE 0.12 % MT SOLN: 15.0000 mL | Freq: Once | OROMUCOSAL | Status: DC

## 2021-02-24 MED ORDER — CEFAZOLIN SODIUM-DEXTROSE 2-4 GM/100ML-% IV SOLN
2.0000 g | INTRAVENOUS | Status: AC
  Administered 2021-02-25: 2 g via INTRAVENOUS

## 2021-02-24 MED ORDER — FAMOTIDINE 20 MG PO TABS
20.0000 mg | ORAL_TABLET | Freq: Once | ORAL | Status: AC
  Administered 2021-02-25: 20 mg via ORAL

## 2021-02-24 MED ORDER — ORAL CARE MOUTH RINSE: 15.0000 mL | Freq: Once | OROMUCOSAL | Status: DC

## 2021-02-24 MED ORDER — LACTATED RINGERS IV SOLN: INTRAVENOUS | Status: DC

## 2021-02-24 NOTE — Patient Instructions (Signed)
Your procedure is scheduled on: 02/25/21 Report to DAY SURGERY DEPARTMENT LOCATED ON 2ND FLOOR MEDICAL MALL ENTRANCE. To find out your arrival time please call [phone] between 1PM - 3PM on 02/24/21.  Remember: Instructions that are not followed completely may result in serious medical risk, up to and including death, or upon the discretion of your surgeon and anesthesiologist your surgery may need to be rescheduled.     _X__ 1. Do not eat food after midnight the night before your procedure.                 No gum chewing or hard candies.   __X__2.  On the morning of surgery brush your teeth with toothpaste and water, you                 may rinse your mouth with mouthwash if you wish.  Do not swallow any              toothpaste of mouthwash.     _X__ 3.  No Alcohol for 24 hours before or after surgery.   _X__ 4.  Do Not Smoke or use e-cigarettes For 24 Hours Prior to Your Surgery.                 Do not use any chewable tobacco products for at least 6 hours prior to                 surgery.  ____  5.  Bring all medications with you on the day of surgery if instructed.   __X__  6.  Notify your doctor if there is any change in your medical condition      (cold, fever, infections).     Do not wear jewelry, make-up, hairpins, clips or nail polish. Do not wear lotions, powders, or perfumes.  Do not shave 48 hours prior to surgery. Men may shave face and neck. Do not bring valuables to the hospital.    [facility] is not responsible for any belongings or valuables.  Contacts, dentures/partials or body piercings may not be worn into surgery. Bring a case for your contacts, glasses or hearing aids, a denture cup will be supplied. Leave your suitcase in the car. After surgery it may be brought to your room. For patients admitted to the hospital, discharge time is determined by your treatment team.   Patients discharged the day of surgery will not be allowed to drive home.   Please read  over the following fact sheets that you were given:     __X__ Take these medicines the morning of surgery with A SIP OF WATER:    1. tamsulosin (FLOMAX) 0.4 MG CAPS capsule  2. oxyCODONE-acetaminophen (PERCOCET) 5-325 MG tablet if needed  3.   4.  5.  6.  ____ Fleet Enema (as directed)   ____ Use CHG Soap/SAGE wipes as directed  ____ Use inhalers on the day of surgery  ____ Stop metformin/Janumet/Farxiga 2 days prior to surgery    ____ Take 1/2 of usual insulin dose the night before surgery. No insulin the morning          of surgery.   ____ Stop Blood Thinners Coumadin/Plavix/Xarelto/Pleta/Pradaxa/Eliquis/Effient/Aspirin  on   Or contact your Surgeon, Cardiologist or Medical Doctor regarding  ability to stop your blood thinners  __X__ Stop Anti-inflammatories 7 days before surgery such as Advil, Ibuprofen, Motrin,  BC or Goodies Powder, Naprosyn, Naproxen, Aleve, Aspirin    __X__ Stop all herbal  supplements, fish oil or vitamin E until after surgery.    ____ Bring C-Pap to the hospital.

## 2021-02-25 ENCOUNTER — Ambulatory Visit: Payer: BC Managed Care – PPO | Admitting: Certified Registered Nurse Anesthetist

## 2021-02-25 ENCOUNTER — Encounter: Payer: Self-pay | Admitting: Urology

## 2021-02-25 ENCOUNTER — Other Ambulatory Visit: Payer: Self-pay

## 2021-02-25 ENCOUNTER — Encounter
Admission: RE | Disposition: A | Discharge status: Home / Self Care | Payer: Self-pay | Source: Home / Self Care | Attending: Urology

## 2021-02-25 ENCOUNTER — Ambulatory Visit: Payer: BC Managed Care – PPO

## 2021-02-25 ENCOUNTER — Ambulatory Visit
Admission: RE | Admit: 2021-02-25 | Discharge: 2021-02-25 | Disposition: A | Discharge status: Home / Self Care | Payer: BC Managed Care – PPO | Attending: Urology | Admitting: Urology

## 2021-02-25 DIAGNOSIS — Z79899 Other long term (current) drug therapy: Secondary | ICD-10-CM | POA: Insufficient documentation

## 2021-02-25 DIAGNOSIS — N1330 Unspecified hydronephrosis: Secondary | ICD-10-CM

## 2021-02-25 DIAGNOSIS — N132 Hydronephrosis with renal and ureteral calculous obstruction: Secondary | ICD-10-CM

## 2021-02-25 DIAGNOSIS — N201 Calculus of ureter: Secondary | ICD-10-CM | POA: Insufficient documentation

## 2021-02-25 HISTORY — PX: CYSTOSCOPY/URETEROSCOPY/HOLMIUM LASER/STENT PLACEMENT: SHX6546

## 2021-02-25 SURGERY — CYSTOSCOPY/URETEROSCOPY/HOLMIUM LASER/STENT PLACEMENT
Anesthesia: General | Laterality: Left

## 2021-02-25 MED ORDER — FAMOTIDINE 20 MG PO TABS
ORAL_TABLET | ORAL | Status: AC
  Filled 2021-02-25: qty 1

## 2021-02-25 MED ORDER — PROMETHAZINE HCL 25 MG/ML IJ SOLN: 6.2500 mg | INTRAMUSCULAR | Status: DC | PRN

## 2021-02-25 MED ORDER — ONDANSETRON HCL 4 MG/2ML IJ SOLN
INTRAMUSCULAR | Status: DC | PRN
Start: 2021-02-25 — End: 2021-02-25
  Administered 2021-02-25: 4 mg via INTRAVENOUS

## 2021-02-25 MED ORDER — MIDAZOLAM HCL 2 MG/2ML IJ SOLN
INTRAMUSCULAR | Status: DC | PRN
  Administered 2021-02-25: 2 mg via INTRAVENOUS

## 2021-02-25 MED ORDER — FENTANYL CITRATE (PF) 100 MCG/2ML IJ SOLN
INTRAMUSCULAR | Status: AC
  Filled 2021-02-25: qty 2

## 2021-02-25 MED ORDER — DEXMEDETOMIDINE HCL IN NACL 400 MCG/100ML IV SOLN
INTRAVENOUS | Status: DC | PRN
Start: 2021-02-25 — End: 2021-02-25
  Administered 2021-02-25: 4 ug via INTRAVENOUS
  Administered 2021-02-25: 8 ug via INTRAVENOUS
  Administered 2021-02-25 (×2): 4 ug via INTRAVENOUS

## 2021-02-25 MED ORDER — FENTANYL CITRATE (PF) 100 MCG/2ML IJ SOLN: 25.0000 ug | INTRAMUSCULAR | Status: DC | PRN

## 2021-02-25 MED ORDER — KETOROLAC TROMETHAMINE 30 MG/ML IJ SOLN
INTRAMUSCULAR | Status: AC
  Filled 2021-02-25: qty 1

## 2021-02-25 MED ORDER — LIDOCAINE HCL (CARDIAC) PF 100 MG/5ML IV SOSY
PREFILLED_SYRINGE | INTRAVENOUS | Status: DC | PRN
  Administered 2021-02-25: 100 mg via INTRAVENOUS

## 2021-02-25 MED ORDER — DEXAMETHASONE SODIUM PHOSPHATE 10 MG/ML IJ SOLN
INTRAMUSCULAR | Status: AC
  Filled 2021-02-25: qty 1

## 2021-02-25 MED ORDER — KETOROLAC TROMETHAMINE 30 MG/ML IJ SOLN
INTRAMUSCULAR | Status: DC | PRN
  Administered 2021-02-25: 30 mg via INTRAVENOUS

## 2021-02-25 MED ORDER — PROPOFOL 10 MG/ML IV BOLUS
INTRAVENOUS | Status: AC
  Filled 2021-02-25: qty 20

## 2021-02-25 MED ORDER — IOPAMIDOL (ISOVUE-200) INJECTION 41%
INTRAVENOUS | Status: DC | PRN
  Administered 2021-02-25: 15 mL
  Administered 2021-02-25: 5 mL

## 2021-02-25 MED ORDER — LIDOCAINE HCL (PF) 2 % IJ SOLN
INTRAMUSCULAR | Status: AC
  Filled 2021-02-25: qty 5

## 2021-02-25 MED ORDER — CEFAZOLIN SODIUM-DEXTROSE 2-4 GM/100ML-% IV SOLN
INTRAVENOUS | Status: AC
  Filled 2021-02-25: qty 100

## 2021-02-25 MED ORDER — DEXAMETHASONE SODIUM PHOSPHATE 10 MG/ML IJ SOLN
INTRAMUSCULAR | Status: DC | PRN
  Administered 2021-02-25: 10 mg via INTRAVENOUS

## 2021-02-25 MED ORDER — ONDANSETRON HCL 4 MG/2ML IJ SOLN
INTRAMUSCULAR | Status: AC
  Filled 2021-02-25: qty 2

## 2021-02-25 MED ORDER — OXYCODONE-ACETAMINOPHEN 5-325 MG PO TABS
1.0000 | ORAL_TABLET | ORAL | 0 refills | Status: DC | PRN
Start: 2021-02-25 — End: 2021-03-10

## 2021-02-25 MED ORDER — MIDAZOLAM HCL 2 MG/2ML IJ SOLN
INTRAMUSCULAR | Status: AC
  Filled 2021-02-25: qty 2

## 2021-02-25 MED ORDER — PROPOFOL 10 MG/ML IV BOLUS
INTRAVENOUS | Status: DC | PRN
  Administered 2021-02-25: 30 mg via INTRAVENOUS
  Administered 2021-02-25 (×2): 50 mg via INTRAVENOUS
  Administered 2021-02-25: 200 mg via INTRAVENOUS

## 2021-02-25 MED ORDER — FENTANYL CITRATE (PF) 100 MCG/2ML IJ SOLN
INTRAMUSCULAR | Status: DC | PRN
  Administered 2021-02-25: 25 ug via INTRAVENOUS
  Administered 2021-02-25: 50 ug via INTRAVENOUS
  Administered 2021-02-25: 25 ug via INTRAVENOUS

## 2021-02-25 MED ORDER — DEXMEDETOMIDINE HCL IN NACL 200 MCG/50ML IV SOLN
INTRAVENOUS | Status: AC
  Filled 2021-02-25: qty 50

## 2021-02-25 MED ORDER — OXYBUTYNIN CHLORIDE 5 MG PO TABS: 5.0000 mg | ORAL_TABLET | Freq: Three times a day (TID) | ORAL | 0 refills | Status: DC | PRN

## 2021-02-25 SURGICAL SUPPLY — 31 items
BAG DRAIN CYSTO-URO LG1000N (MISCELLANEOUS) ×2 IMPLANT
BASKET ZERO TIP 1.9FR (BASKET) IMPLANT
BRUSH SCRUB EZ 1% IODOPHOR (MISCELLANEOUS) ×2 IMPLANT
BSKT STON RTRVL ZERO TP 1.9FR (BASKET)
CATH URET FLEX-TIP 2 LUMEN 10F (CATHETERS) ×2 IMPLANT
CATH URETL OPEN 5X70 (CATHETERS) ×2 IMPLANT
CNTNR SPEC 2.5X3XGRAD LEK (MISCELLANEOUS)
CONT SPEC 4OZ STER OR WHT (MISCELLANEOUS)
CONT SPEC 4OZ STRL OR WHT (MISCELLANEOUS)
CONTAINER SPEC 2.5X3XGRAD LEK (MISCELLANEOUS) IMPLANT
DRAPE UTILITY 15X26 TOWEL STRL (DRAPES) ×2 IMPLANT
Flexiva Pulse ID trac tip ×2 IMPLANT
GAUZE 4X4 16PLY ~~LOC~~+RFID DBL (SPONGE) ×4 IMPLANT
GLIDEWIRE STR 0.035 150CM 3CM (WIRE) ×2 IMPLANT
GLOVE SURG ENC MOIS LTX SZ6.5 (GLOVE) ×8 IMPLANT
GOWN STRL REUS W/ TWL LRG LVL3 (GOWN DISPOSABLE) ×2 IMPLANT
GOWN STRL REUS W/TWL LRG LVL3 (GOWN DISPOSABLE) ×4
GUIDEWIRE GREEN .038 145CM (MISCELLANEOUS) ×2 IMPLANT
GUIDEWIRE STR DUAL SENSOR (WIRE) ×4 IMPLANT
INFUSOR MANOMETER BAG 3000ML (MISCELLANEOUS) ×2 IMPLANT
IV NS IRRIG 3000ML ARTHROMATIC (IV SOLUTION) ×2 IMPLANT
KIT TURNOVER CYSTO (KITS) ×2 IMPLANT
MANIFOLD NEPTUNE II (INSTRUMENTS) ×2 IMPLANT
PACK CYSTO AR (MISCELLANEOUS) ×2 IMPLANT
SET CYSTO W/LG BORE CLAMP LF (SET/KITS/TRAYS/PACK) ×2 IMPLANT
SHEATH URETERAL 12FRX35CM (MISCELLANEOUS) IMPLANT
STENT URET 6FRX24 CONTOUR (STENTS) IMPLANT
STENT URET 6FRX26 CONTOUR (STENTS) ×2 IMPLANT
SURGILUBE 2OZ TUBE FLIPTOP (MISCELLANEOUS) ×2 IMPLANT
TRACTIP FLEXIVA PULSE ID 200 (Laser) ×2 IMPLANT
WATER STERILE IRR 1000ML POUR (IV SOLUTION) ×2 IMPLANT

## 2021-02-25 NOTE — Interval H&P Note (Signed)
History and Physical Interval Note:  02/25/2021 12:38 PM  Jeffery Everett  has presented today for surgery, with the diagnosis of Left Ureteral Stone, Left Hydronephrosis.  The various methods of treatment have been discussed with the patient and family. After consideration of risks, benefits and other options for treatment, the patient has consented to  Procedure(s) with comments: CYSTOSCOPY/URETEROSCOPY/HOLMIUM LASER/STENT PLACEMENT (Left) - Holium as a surgical intervention.  The patient's history has been reviewed, patient examined, no change in status, stable for surgery.  I have reviewed the patient's chart and labs.  Questions were answered to the patient's satisfaction.    RRR CTAB  [name]

## 2021-02-25 NOTE — Anesthesia Postprocedure Evaluation (Signed)
Anesthesia Post Note  Patient: Jeffery Everett  Procedure(s) Performed: CYSTOSCOPY/URETEROSCOPY/HOLMIUM LASER/STENT PLACEMENT (Left)  Patient location during evaluation: PACU Anesthesia Type: General Level of consciousness: awake and alert Pain management: pain level controlled Vital Signs Assessment: post-procedure vital signs reviewed and stable Respiratory status: spontaneous breathing, nonlabored ventilation, respiratory function stable and patient connected to nasal cannula oxygen Cardiovascular status: blood pressure returned to baseline and stable Postop Assessment: no apparent nausea or vomiting Anesthetic complications: no   No notable events documented.   Last Vitals:  Vitals:   02/25/21 1447 02/25/21 1458  BP: 122/78 132/87  Pulse: 85 70  Resp: 15 17  Temp: (!) 36.2 C (!) 36.1 C  SpO2: 97% 98%    Last Pain:  Vitals:   02/25/21 1458  TempSrc: Tympanic  PainSc: 0-No pain                 Lenard Simmer

## 2021-02-25 NOTE — Anesthesia Preprocedure Evaluation (Signed)
Anesthesia Evaluation  Patient identified by MRN, date of birth, ID band Patient awake    Reviewed: Allergy & Precautions, H&P , NPO status , reviewed documented beta blocker date and time   History of Anesthesia Complications Negative for: history of anesthetic complications  Airway Mallampati: II  TM Distance: >3 FB Neck ROM: full    Dental  (+) Chipped, Poor Dentition, Missing, Dental Advidsory Given Very poor dentition, none loose:   Pulmonary neg pulmonary ROS,    Pulmonary exam normal        Cardiovascular Exercise Tolerance: Good negative cardio ROS Normal cardiovascular exam     Neuro/Psych PSYCHIATRIC DISORDERS ADDnegative neurological ROS     GI/Hepatic Neg liver ROS, neg GERD  ,  Endo/Other  neg diabetesMorbid obesity  Renal/GU negative Renal ROS     Musculoskeletal   Abdominal   Peds  Hematology   Anesthesia Other Findings Past Medical History: No date: ADHD (attention deficit hyperactivity disorder) Past Surgical History: No date: NO PAST SURGERIES     Comment:  Verified with patient (11/25/15)   Reproductive/Obstetrics                             Anesthesia Physical  Anesthesia Plan  ASA: 3  Anesthesia Plan: General   Post-op Pain Management:    Induction: Intravenous  PONV Risk Score and Plan: 3 and Ondansetron, Treatment may vary due to age or medical condition, Midazolam and Dexamethasone  Airway Management Planned: LMA and Oral ETT  Additional Equipment:   Intra-op Plan:   Post-operative Plan: Extubation in OR  Informed Consent: I have reviewed the patients History and Physical, chart, labs and discussed the procedure including the risks, benefits and alternatives for the proposed anesthesia with the patient or authorized representative who has indicated his/her understanding and acceptance.     Dental Advisory Given  Plan Discussed with:  CRNA  Anesthesia Plan Comments: (LMA vs ETT per surgeon requirement. UDS pending)        Anesthesia Quick Evaluation

## 2021-02-25 NOTE — Discharge Instructions (Addendum)
You have a ureteral stent in place.  This is a tube that extends from your kidney to your bladder.  This may cause urinary bleeding, burning with urination, and urinary frequency.  Please call our office or present to the ED if you develop fevers >101 or pain which is not able to be controlled with oral pain medications.  You may be given either Flomax and/ or ditropan to help with bladder spasms and stent pain in addition to pain medications.    [facility] [address], [address] [place], [place] [address] [phone]  AMBULATORY SURGERY  DISCHARGE INSTRUCTIONS   The drugs that you were given will stay in your system until tomorrow so for the next 24 hours you should not:  Drive an automobile Make any legal decisions Drink any alcoholic beverage   You may resume regular meals tomorrow.  Today it is better to start with liquids and gradually work up to solid foods.  You may eat anything you prefer, but it is better to start with liquids, then soup and crackers, and gradually work up to solid foods.   Please notify your doctor immediately if you have any unusual bleeding, trouble breathing, redness and pain at the surgery site, drainage, fever, or pain not relieved by medication.    Additional Instructions:   Please contact your physician with any problems or Same Day Surgery at [phone], Monday through Friday 6 am to 4 pm, or [place] [facility] [place] Main number at [phone].  

## 2021-02-25 NOTE — Op Note (Signed)
Date of procedure: 02/25/21  Preoperative diagnosis:  3 mm left distal ureteral calculus  Postoperative diagnosis:  Same as above Impacted ureteral calculus with high-grade obstruction  Procedure: Right ureteroscopy Retrograde pyelogram on right Right ureteral stent placement Interpretation fluoroscopy less than 30 minutes  Surgeon: [name], MD  Anesthesia: General  Complications: None  Intraoperative findings: Impacted 3 mm left distal ureteral calculus to the level of the iliacs, initially able to get semirigid ureteroscope to the level of the stone using railroad technique over second wire but ultimately, unable to treat the stone.  Ureteral stent placed with plan for staged procedure.  EBL: Minimal  Specimens: None  Drains: 6 x 26 French double-J ureteral stent on left  Indication: Jeffery Everett is a 25 y.o. patient with small left distal ureteral calculus is failed to pass spontaneously.  After reviewing the management options for treatment, he elected to proceed with the above surgical procedure(s). We have discussed the potential benefits and risks of the procedure, side effects of the proposed treatment, the likelihood of the patient achieving the goals of the procedure, and any potential problems that might occur during the procedure or recuperation. Informed consent has been obtained.  Description of procedure:  The patient was taken to the operating room and general anesthesia was induced.  The patient was placed in the dorsal lithotomy position, prepped and draped in the usual sterile fashion, and preoperative antibiotics were administered. A preoperative time-out was performed.   A 21 French scope was advanced per urethra into the bladder.  Attention was turned to the left ureteral orifice from which was cannulated using a 5 [place] open-ended ureteral catheter.  Gentle retrograde pyelogram was performed by attending contrast through the UO at which time a  high-grade ureteral obstruction was seen up to the level of the iliacs were no contrast E flux above this level.  This was the level of the stone on CT scan.  Initially, had some difficulty placing a sensor wire around the stone and used a maneuver using the open-ended ureteral catheter to probe until ultimately I was able to bypass the stone and get the wire up to the level of the kidney.  This was now to place as a safety wire.  I then advanced a 4.5 French semirigid ureteroscope into the distal ureter.  I first encountered 1 narrowed area which I was unable to traverse.  I ended up using a Super Stiff wire with a second wire just distal to this area to perform a railroad technique over the wire through the small area.  I was able to further advance up to the level believed to be the stone however was not able to get the angle.  There were some very edematous mucosa at this level possibly than a small flap of tissue, this was suggestive of a highly impacted stone.  Was unable to get the Super Stiff wire around this to help railroad through the area in order to identify the actual stone.  He had hip using an angled Glidewire eventually which did go beyond the level of the stone I was able to railroad over this past the level of where the previous impaction with the stone was dislodged and the stone was encountered.  I then brought in a 200 m laser fiber but unfortunately, when I remove the wire, the ureter angled back and there was a fold in a very redundant more proximal ureter from the high-grade obstruction and it was notable over to  reach the level of the stone.  I then removed the scope and tried to attempt to use a flexible scope to chase the stone more proximally.  Used a dual-lumen to introduce a Super Stiff wire up to the level of the kidney.  I shot some contrast before did this showing a high-grade obstruction near the level where the stone previous been lodged but now some fairly significant proximal  hydronephrosis to the level of the stone.  There is no contrast extravasation.  Over the Super Stiff wire, I tried to advance a digital flexible 7 French flexible ureteroscope but I was unable to traverse this narrowed area safely.  As such, I elected to return for staged procedure.  I backloaded the safety wire over a rigid cystoscope and a 6 x 26 French double-J ureteral stent was placed successfully at the level of the renal pelvis and bladder with a coil in each.  The bladder was then drained.  He was cleaned and dried, repositioned in supine position, reversed of anesthesia, taken to the PACU in stable condition.  Plan: We will have him return in 7 to 10 days for staged ureteroscopy.  [name], M.D.

## 2021-02-25 NOTE — Transfer of Care (Signed)
Immediate Anesthesia Transfer of Care Note  Patient: Jentzen Minasyan  Procedure(s) Performed: CYSTOSCOPY/URETEROSCOPY/HOLMIUM LASER/STENT PLACEMENT (Left)  Patient Location: PACU  Anesthesia Type:General  Level of Consciousness: drowsy  Airway & Oxygen Therapy: Patient Spontanous Breathing and Patient connected to face mask oxygen  Post-op Assessment: Report given to RN and Post -op Vital signs reviewed and stable  Post vital signs: Reviewed and stable  Last Vitals:  Vitals Value Taken Time  BP    Temp    Pulse 64 02/25/21 1405  Resp 11 02/25/21 1405  SpO2 96 % 02/25/21 1405  Vitals shown include unvalidated device data.  Last Pain:  Vitals:   02/25/21 0958  TempSrc: Temporal  PainSc: 0-No pain         Complications: No notable events documented.

## 2021-02-25 NOTE — Anesthesia Procedure Notes (Signed)
Procedure Name: LMA Insertion Date/Time: 02/25/2021 12:47 PM Performed by: Malva Cogan, CRNA Pre-anesthesia Checklist: Patient identified, Patient being monitored, Timeout performed, Emergency Drugs available and Suction available Patient Re-evaluated:Patient Re-evaluated prior to induction Oxygen Delivery Method: Circle system utilized Preoxygenation: Pre-oxygenation with 100% oxygen Induction Type: IV induction Ventilation: Mask ventilation without difficulty LMA: LMA inserted LMA Size: 5.0 Tube type: Oral Number of attempts: 1 Placement Confirmation: positive ETCO2 and breath sounds checked- equal and bilateral Tube secured with: Tape Dental Injury: Teeth and Oropharynx as per pre-operative assessment

## 2021-02-26 ENCOUNTER — Other Ambulatory Visit: Payer: Self-pay | Admitting: Urology

## 2021-02-26 ENCOUNTER — Encounter: Payer: Self-pay | Admitting: Urology

## 2021-02-26 DIAGNOSIS — N200 Calculus of kidney: Secondary | ICD-10-CM

## 2021-03-01 ENCOUNTER — Other Ambulatory Visit: Payer: Self-pay | Admitting: Urology

## 2021-03-01 DIAGNOSIS — N1330 Unspecified hydronephrosis: Secondary | ICD-10-CM

## 2021-03-01 DIAGNOSIS — N201 Calculus of ureter: Secondary | ICD-10-CM

## 2021-03-02 ENCOUNTER — Other Ambulatory Visit: Payer: Self-pay | Admitting: *Deleted

## 2021-03-02 DIAGNOSIS — N201 Calculus of ureter: Secondary | ICD-10-CM

## 2021-03-03 ENCOUNTER — Other Ambulatory Visit: Payer: Self-pay

## 2021-03-03 ENCOUNTER — Other Ambulatory Visit: Payer: BC Managed Care – PPO

## 2021-03-03 DIAGNOSIS — N201 Calculus of ureter: Secondary | ICD-10-CM

## 2021-03-09 LAB — CULTURE, URINE COMPREHENSIVE

## 2021-03-09 MED ORDER — CEFAZOLIN SODIUM-DEXTROSE 2-4 GM/100ML-% IV SOLN
2.0000 g | INTRAVENOUS | Status: AC
  Administered 2021-03-10: 3 g via INTRAVENOUS

## 2021-03-10 ENCOUNTER — Other Ambulatory Visit: Payer: Self-pay

## 2021-03-10 ENCOUNTER — Encounter
Admission: RE | Disposition: A | Discharge status: Home / Self Care | Payer: Self-pay | Source: Home / Self Care | Attending: Urology

## 2021-03-10 ENCOUNTER — Ambulatory Visit
Admission: RE | Admit: 2021-03-10 | Discharge: 2021-03-10 | Disposition: A | Discharge status: Home / Self Care | Payer: BC Managed Care – PPO | Attending: Urology | Admitting: Urology

## 2021-03-10 ENCOUNTER — Ambulatory Visit: Payer: BC Managed Care – PPO

## 2021-03-10 ENCOUNTER — Ambulatory Visit: Payer: BC Managed Care – PPO | Admitting: Certified Registered"

## 2021-03-10 ENCOUNTER — Encounter: Payer: Self-pay | Admitting: Urology

## 2021-03-10 DIAGNOSIS — N201 Calculus of ureter: Secondary | ICD-10-CM

## 2021-03-10 DIAGNOSIS — Z79899 Other long term (current) drug therapy: Secondary | ICD-10-CM | POA: Insufficient documentation

## 2021-03-10 DIAGNOSIS — N135 Crossing vessel and stricture of ureter without hydronephrosis: Secondary | ICD-10-CM | POA: Insufficient documentation

## 2021-03-10 DIAGNOSIS — N202 Calculus of kidney with calculus of ureter: Secondary | ICD-10-CM | POA: Diagnosis present

## 2021-03-10 DIAGNOSIS — N1330 Unspecified hydronephrosis: Secondary | ICD-10-CM

## 2021-03-10 HISTORY — PX: CYSTOSCOPY/URETEROSCOPY/HOLMIUM LASER/STENT PLACEMENT: SHX6546

## 2021-03-10 SURGERY — CYSTOSCOPY/URETEROSCOPY/HOLMIUM LASER/STENT PLACEMENT
Anesthesia: General | Laterality: Left

## 2021-03-10 MED ORDER — FAMOTIDINE 20 MG PO TABS
ORAL_TABLET | ORAL | Status: AC
  Filled 2021-03-10: qty 1

## 2021-03-10 MED ORDER — FENTANYL CITRATE (PF) 100 MCG/2ML IJ SOLN: 25.0000 ug | INTRAMUSCULAR | Status: DC | PRN

## 2021-03-10 MED ORDER — DIAZEPAM 10 MG PO TABS: 10.0000 mg | ORAL_TABLET | Freq: Once | ORAL | 0 refills | Status: AC

## 2021-03-10 MED ORDER — ONDANSETRON HCL 4 MG/2ML IJ SOLN: 4.0000 mg | Freq: Once | INTRAMUSCULAR | Status: DC | PRN

## 2021-03-10 MED ORDER — FAMOTIDINE 20 MG PO TABS
20.0000 mg | ORAL_TABLET | Freq: Once | ORAL | Status: AC
  Administered 2021-03-10: 20 mg via ORAL

## 2021-03-10 MED ORDER — KETOROLAC TROMETHAMINE 30 MG/ML IJ SOLN
INTRAMUSCULAR | Status: DC | PRN
  Administered 2021-03-10: 30 mg via INTRAVENOUS

## 2021-03-10 MED ORDER — CHLORHEXIDINE GLUCONATE 0.12 % MT SOLN: 15.0000 mL | Freq: Once | OROMUCOSAL | Status: AC

## 2021-03-10 MED ORDER — OXYCODONE-ACETAMINOPHEN 5-325 MG PO TABS: 1.0000 | ORAL_TABLET | ORAL | 0 refills | Status: DC | PRN

## 2021-03-10 MED ORDER — IOHEXOL 180 MG/ML  SOLN
INTRAMUSCULAR | Status: DC | PRN
  Administered 2021-03-10: 20 mL

## 2021-03-10 MED ORDER — ROCURONIUM BROMIDE 100 MG/10ML IV SOLN
INTRAVENOUS | Status: DC | PRN
  Administered 2021-03-10: 20 mg via INTRAVENOUS
  Administered 2021-03-10: 60 mg via INTRAVENOUS

## 2021-03-10 MED ORDER — OXYCODONE HCL 5 MG/5ML PO SOLN
5.0000 mg | Freq: Once | ORAL | Status: DC | PRN
Start: 2021-03-10 — End: 2021-03-10

## 2021-03-10 MED ORDER — ORAL CARE MOUTH RINSE: 15.0000 mL | Freq: Once | OROMUCOSAL | Status: AC

## 2021-03-10 MED ORDER — LIDOCAINE HCL (CARDIAC) PF 100 MG/5ML IV SOSY
PREFILLED_SYRINGE | INTRAVENOUS | Status: DC | PRN
  Administered 2021-03-10: 100 mg via INTRAVENOUS

## 2021-03-10 MED ORDER — DEXMEDETOMIDINE (PRECEDEX) IN NS 20 MCG/5ML (4 MCG/ML) IV SYRINGE
PREFILLED_SYRINGE | INTRAVENOUS | Status: DC | PRN
  Administered 2021-03-10: 8 ug via INTRAVENOUS
  Administered 2021-03-10 (×2): 12 ug via INTRAVENOUS

## 2021-03-10 MED ORDER — ACETAMINOPHEN 10 MG/ML IV SOLN
INTRAVENOUS | Status: DC | PRN
  Administered 2021-03-10: 1000 mg via INTRAVENOUS

## 2021-03-10 MED ORDER — DEXMEDETOMIDINE (PRECEDEX) IN NS 20 MCG/5ML (4 MCG/ML) IV SYRINGE
PREFILLED_SYRINGE | INTRAVENOUS | Status: AC
  Filled 2021-03-10: qty 5

## 2021-03-10 MED ORDER — CEFAZOLIN SODIUM-DEXTROSE 2-4 GM/100ML-% IV SOLN
INTRAVENOUS | Status: AC
  Filled 2021-03-10: qty 100

## 2021-03-10 MED ORDER — PROPOFOL 10 MG/ML IV BOLUS
INTRAVENOUS | Status: DC | PRN
  Administered 2021-03-10: 200 mg via INTRAVENOUS

## 2021-03-10 MED ORDER — CHLORHEXIDINE GLUCONATE 0.12 % MT SOLN
OROMUCOSAL | Status: AC
  Administered 2021-03-10: 15 mL via OROMUCOSAL
  Filled 2021-03-10: qty 15

## 2021-03-10 MED ORDER — MIDAZOLAM HCL 2 MG/2ML IJ SOLN
INTRAMUSCULAR | Status: AC
  Filled 2021-03-10: qty 2

## 2021-03-10 MED ORDER — STERILE WATER FOR IRRIGATION IR SOLN
Status: DC | PRN
  Administered 2021-03-10: 1000 mL

## 2021-03-10 MED ORDER — FENTANYL CITRATE (PF) 100 MCG/2ML IJ SOLN
INTRAMUSCULAR | Status: AC
  Filled 2021-03-10: qty 2

## 2021-03-10 MED ORDER — OXYCODONE HCL 5 MG PO TABS: 5.0000 mg | ORAL_TABLET | Freq: Once | ORAL | Status: DC | PRN

## 2021-03-10 MED ORDER — MIDAZOLAM HCL 2 MG/2ML IJ SOLN
INTRAMUSCULAR | Status: DC | PRN
  Administered 2021-03-10: 2 mg via INTRAVENOUS

## 2021-03-10 MED ORDER — ACETAMINOPHEN 10 MG/ML IV SOLN: 1000.0000 mg | Freq: Once | INTRAVENOUS | Status: DC | PRN

## 2021-03-10 MED ORDER — SUGAMMADEX SODIUM 200 MG/2ML IV SOLN
INTRAVENOUS | Status: DC | PRN
  Administered 2021-03-10: 300 mg via INTRAVENOUS

## 2021-03-10 MED ORDER — ACETAMINOPHEN 10 MG/ML IV SOLN
INTRAVENOUS | Status: AC
  Filled 2021-03-10: qty 100

## 2021-03-10 MED ORDER — FENTANYL CITRATE (PF) 100 MCG/2ML IJ SOLN
INTRAMUSCULAR | Status: DC | PRN
  Administered 2021-03-10: 100 ug via INTRAVENOUS

## 2021-03-10 MED ORDER — LACTATED RINGERS IV SOLN: INTRAVENOUS | Status: DC

## 2021-03-10 MED ORDER — SODIUM CHLORIDE 0.9 % IR SOLN
Status: DC | PRN
Start: 2021-03-10 — End: 2021-03-10
  Administered 2021-03-10: 3000 mL

## 2021-03-10 MED ORDER — PROPOFOL 10 MG/ML IV BOLUS
INTRAVENOUS | Status: AC
  Filled 2021-03-10: qty 20

## 2021-03-10 MED ORDER — ONDANSETRON HCL 4 MG/2ML IJ SOLN
INTRAMUSCULAR | Status: DC | PRN
  Administered 2021-03-10: 4 mg via INTRAVENOUS

## 2021-03-10 MED ORDER — DEXAMETHASONE SODIUM PHOSPHATE 10 MG/ML IJ SOLN
INTRAMUSCULAR | Status: DC | PRN
  Administered 2021-03-10: 10 mg via INTRAVENOUS

## 2021-03-10 SURGICAL SUPPLY — 31 items
BAG DRAIN CYSTO-URO LG1000N (MISCELLANEOUS) ×2 IMPLANT
BASKET ZERO TIP 1.9FR (BASKET) ×2 IMPLANT
BRUSH SCRUB EZ 1% IODOPHOR (MISCELLANEOUS) ×2 IMPLANT
BSKT STON RTRVL ZERO TP 1.9FR (BASKET) ×1
CATH URET FLEX-TIP 2 LUMEN 10F (CATHETERS) ×2 IMPLANT
CATH URETL OPEN 5X70 (CATHETERS) ×2 IMPLANT
CNTNR SPEC 2.5X3XGRAD LEK (MISCELLANEOUS) ×1
CONT SPEC 4OZ STER OR WHT (MISCELLANEOUS) ×1
CONT SPEC 4OZ STRL OR WHT (MISCELLANEOUS) ×1
CONTAINER SPEC 2.5X3XGRAD LEK (MISCELLANEOUS) ×1 IMPLANT
DRAPE UTILITY 15X26 TOWEL STRL (DRAPES) ×2 IMPLANT
FIBER LASER MOSES 200 DFL (Laser) ×2 IMPLANT
GAUZE 4X4 16PLY ~~LOC~~+RFID DBL (SPONGE) ×4 IMPLANT
GLOVE SURG ENC MOIS LTX SZ6.5 (GLOVE) ×2 IMPLANT
GOWN STRL REUS W/ TWL LRG LVL3 (GOWN DISPOSABLE) ×2 IMPLANT
GOWN STRL REUS W/TWL LRG LVL3 (GOWN DISPOSABLE) ×4
GUIDEWIRE GREEN .038 145CM (MISCELLANEOUS) ×4 IMPLANT
GUIDEWIRE STR DUAL SENSOR (WIRE) ×2 IMPLANT
INFUSOR MANOMETER BAG 3000ML (MISCELLANEOUS) ×2 IMPLANT
IV NS IRRIG 3000ML ARTHROMATIC (IV SOLUTION) ×2 IMPLANT
KIT TURNOVER CYSTO (KITS) ×2 IMPLANT
MANIFOLD NEPTUNE II (INSTRUMENTS) ×2 IMPLANT
PACK CYSTO AR (MISCELLANEOUS) ×2 IMPLANT
SET CYSTO W/LG BORE CLAMP LF (SET/KITS/TRAYS/PACK) ×2 IMPLANT
SHEATH URETERAL 12FRX35CM (MISCELLANEOUS) IMPLANT
STENT CONTOUR 7FRX26 (STENTS) ×2 IMPLANT
STENT URET 6FRX24 CONTOUR (STENTS) IMPLANT
STENT URET 6FRX26 CONTOUR (STENTS) IMPLANT
SURGILUBE 2OZ TUBE FLIPTOP (MISCELLANEOUS) ×2 IMPLANT
TRACTIP FLEXIVA PULSE ID 200 (Laser) IMPLANT
WATER STERILE IRR 1000ML POUR (IV SOLUTION) ×2 IMPLANT

## 2021-03-10 NOTE — Anesthesia Postprocedure Evaluation (Signed)
Anesthesia Post Note  Patient: Jeffery Everett  Procedure(s) Performed: CYSTOSCOPY/URETEROSCOPY/HOLMIUM LASER/STENT EXCHANGE (Left)  Patient location during evaluation: PACU Anesthesia Type: General Level of consciousness: awake and alert Pain management: pain level controlled Vital Signs Assessment: post-procedure vital signs reviewed and stable Respiratory status: spontaneous breathing, nonlabored ventilation, respiratory function stable and patient connected to nasal cannula oxygen Cardiovascular status: blood pressure returned to baseline and stable Postop Assessment: no apparent nausea or vomiting Anesthetic complications: no   No notable events documented.   Last Vitals:  Vitals:   03/10/21 1521 03/10/21 1531  BP:  120/73  Pulse: 78 68  Resp: (!) 8 16  Temp: (!) 36.1 C 36.4 C  SpO2: 94% 100%    Last Pain:  Vitals:   03/10/21 1531  TempSrc: Temporal  PainSc: 0-No pain                 Corinda Gubler

## 2021-03-10 NOTE — Interval H&P Note (Signed)
History and Physical Interval Note:  03/10/2021 1:18 PM  Jeffery Everett  has presented today for surgery, with the diagnosis of left ureteral stone,left hydronephrosis.  The various methods of treatment have been discussed with the patient and family. After consideration of risks, benefits and other options for treatment, the patient has consented to  Procedure(s): CYSTOSCOPY/URETEROSCOPY/HOLMIUM LASER/STENT EXCHANGE (Left) as a surgical intervention.  The patient's history has been reviewed, patient examined, no change in status, stable for surgery.  I have reviewed the patient's chart and labs.  Questions were answered to the patient's satisfaction.    Returns today for staged procedure  RRR CTAB  [name]

## 2021-03-10 NOTE — Anesthesia Preprocedure Evaluation (Signed)
Anesthesia Evaluation  Patient identified by MRN, date of birth, ID band Patient awake    Reviewed: Allergy & Precautions, H&P , NPO status , Patient's Chart, lab work & pertinent test results, reviewed documented beta blocker date and time   History of Anesthesia Complications Negative for: history of anesthetic complications  Airway Mallampati: II  TM Distance: >3 FB Neck ROM: full    Dental  (+) Chipped, Poor Dentition, Missing, Dental Advidsory Given Very poor dentition, none loose:   Pulmonary neg pulmonary ROS, neg sleep apnea, neg COPD, Patient abstained from smoking.Not current smoker,    Pulmonary exam normal breath sounds clear to auscultation       Cardiovascular Exercise Tolerance: Good METS(-) hypertension(-) CAD and (-) Past MI negative cardio ROS Normal cardiovascular exam(-) dysrhythmias  Rhythm:Regular Rate:Normal     Neuro/Psych PSYCHIATRIC DISORDERS ADDnegative neurological ROS     GI/Hepatic Neg liver ROS, neg GERD  ,  Endo/Other  neg diabetesMorbid obesity  Renal/GU negative Renal ROS     Musculoskeletal   Abdominal (+) + obese,   Peds  Hematology   Anesthesia Other Findings Past Medical History: No date: ADHD (attention deficit hyperactivity disorder) Past Surgical History:    Reproductive/Obstetrics                             Anesthesia Physical  Anesthesia Plan  ASA: 3  Anesthesia Plan: General   Post-op Pain Management:    Induction: Intravenous  PONV Risk Score and Plan: 3 and Ondansetron, Treatment may vary due to age or medical condition, Midazolam and Dexamethasone  Airway Management Planned: LMA and Oral ETT  Additional Equipment: None  Intra-op Plan:   Post-operative Plan: Extubation in OR  Informed Consent: I have reviewed the patients History and Physical, chart, labs and discussed the procedure including the risks, benefits and  alternatives for the proposed anesthesia with the patient or authorized representative who has indicated his/her understanding and acceptance.     Dental Advisory Given  Plan Discussed with: CRNA  Anesthesia Plan Comments: (Discussed risks of anesthesia with patient, including PONV, sore throat, lip/dental damage. Rare risks discussed as well, such as cardiorespiratory and neurological sequelae, and allergic reactions. Patient understands.)        Anesthesia Quick Evaluation

## 2021-03-10 NOTE — Anesthesia Procedure Notes (Signed)
Procedure Name: Intubation Date/Time: 03/10/2021 1:42 PM Performed by: Jaye Beagle, CRNA Pre-anesthesia Checklist: Patient identified, Emergency Drugs available, Suction available and Patient being monitored Patient Re-evaluated:Patient Re-evaluated prior to induction Oxygen Delivery Method: Circle system utilized Preoxygenation: Pre-oxygenation with 100% oxygen Induction Type: IV induction Ventilation: Mask ventilation without difficulty Laryngoscope Size: McGraph and 4 Grade View: Grade I Tube type: Oral Tube size: 7.5 mm Number of attempts: 1 Airway Equipment and Method: Stylet and Oral airway Placement Confirmation: ETT inserted through vocal cords under direct vision, positive ETCO2 and breath sounds checked- equal and bilateral Secured at: 23 cm Tube secured with: Tape Dental Injury: Teeth and Oropharynx as per pre-operative assessment

## 2021-03-10 NOTE — Transfer of Care (Signed)
Immediate Anesthesia Transfer of Care Note  Patient: Jeffery Everett  Procedure(s) Performed: CYSTOSCOPY/URETEROSCOPY/HOLMIUM LASER/STENT EXCHANGE (Left)  Patient Location: PACU  Anesthesia Type:General  Level of Consciousness: drowsy  Airway & Oxygen Therapy: Patient Spontanous Breathing and Patient connected to face mask oxygen  Post-op Assessment: Report given to RN  Post vital signs: stable  Last Vitals:  Vitals Value Taken Time  BP 132/90 03/10/21 1447  Temp    Pulse 81 03/10/21 1448  Resp 16 03/10/21 1448  SpO2 100 % 03/10/21 1448  Vitals shown include unvalidated device data.  Last Pain:  Vitals:   03/10/21 1032  TempSrc: Temporal  PainSc: 0-No pain         Complications: No notable events documented.

## 2021-03-10 NOTE — Op Note (Signed)
Date of procedure: 03/10/21  Preoperative diagnosis:  Left ureteral stricture Left ureteral calculus  Postoperative diagnosis:  Same as above  Procedure: Left ureteroscopy with laser lithotripsy Left retrograde pyelogram Left ureteral stent exchange Basket extraction of stone fragment Interpretation fluoroscopy less than 30 minutes  Surgeon: [name], MD  Anesthesia: General  Complications: None  Intraoperative findings: Persistent severe distal ureteral stricture just above the level of the iliacs with proximal dilation, very difficult endoscopic manipulation today.  Ultimately, stone identified and fragmented did into 5 or 6 smaller pieces.  A few of these were able to be extracted via nitinol basket but there was some residual smaller fragments that retropulsed, likely less than a millimeter each.  7 French stent replaced for passive ureteral dilation.  EBL: Minimal  Specimens: None  Drains: 7 x 26 French double-J ureteral stent on left  Indication: Jeffery Everett is a 25 y.o. patient with personal history of kidney stones/ureteral stricture who returns today for staged ureteroscopy, see previous notes for details..  After reviewing the management options for treatment, he elected to proceed with the above surgical procedure(s). We have discussed the potential benefits and risks of the procedure, side effects of the proposed treatment, the likelihood of the patient achieving the goals of the procedure, and any potential problems that might occur during the procedure or recuperation. Informed consent has been obtained.  Description of procedure:  The patient was taken to the operating room and general anesthesia was induced.  The patient was placed in the dorsal lithotomy position, prepped and draped in the usual sterile fashion, and preoperative antibiotics were administered. A preoperative time-out was performed.   A 21 French the scope was advanced per urethra into  the bladder.  Attention was turned to the left ureteral orifice from which a ureteral stent was seen emanating.  The distal stent was grasped and brought to level the urethral meatus.  On scout imaging, the stone was not readily visible.  Advance a sensor wire up to the level of the kidney and remove the stent.  I used a dual-lumen ureteral access sheath into the distal ureter first that she retrograde pyelogram which showed a persistent high-grade obstruction with proximal dilation with the stone had previously been lodged.  I then used the railroad technique over the [employer] wire which was introduced with a dual-lumen to try to get a flexible digital ureteroscope through this area unsuccessfully.  I tried to advance the access sheath to the distal ureter to help give the ureter more stability and this was also unsuccessful.  I then ended up using a 4.5 semirigid ureteroscope and had to use a railroad technique with 2 wires as well to get through the strictured area where the stone was encountered.  A 200 m laser fiber was then brought in and using dusting settings, I fragmented the stone to 5 or 6 smaller pieces.  Unfortunately, a few of these pieces retropulsed.  There probably no larger than 1 mm in size.  I was able to basket out a few of the smaller pieces using 1.5 tipless nitinol basket which are unloaded into the bladder.  A final retrograde pyelogram showed no contrast extravasation.  Notably, upon manipulating the ureteroscope, his relatively tight and fixed and is very concerned about further injuring the ureter.  As such, given the inability to pass a flexible scope beyond the strictured area, as well as the tightness with a semirigid ureteroscope, elected not to go after the retropulsed small  fragments as I felt that there would be more harm than benefit at this point in time.  Lastly, I backloaded the safety wire over rigid cystoscope and placed a 7 x 26 French double-J ureteral stent up to the  level of the kidney.  Upon withdrawing the wire, curl was noted within the renal pelvis as well as within the bladder.  The bladder was drained.  The patient was cleaned and dried, reversed myesthesia and taken the PACU in stable condition.  Plan: I discussed intraoperative findings with his mother.  She is aware that there is still some very small fragments but these hopefully will pass given the relatively small size.  I did place a larger stent today and will leave it for prolonged period of time, approximately 4 weeks prior to stent removal to help try to keep this area patent.  She is also interested in metabolic evaluation down the road from to help prevent stone disease moving forward.  Also, we will plan to give him an valium prior to stent removal in the office.  [name], M.D.

## 2021-03-10 NOTE — Discharge Instructions (Addendum)
You have a ureteral stent in place.  This is a tube that extends from your kidney to your bladder.  This may cause urinary bleeding, burning with urination, and urinary frequency.  Please call our office or present to the ED if you develop fevers >101 or pain which is not able to be controlled with oral pain medications.  You may be given either Flomax and/ or ditropan to help with bladder spasms and stent pain in addition to pain medications.    [facility] [address], [address] [place], [place] [address] [phone]  AMBULATORY SURGERY  DISCHARGE INSTRUCTIONS   The drugs that you were given will stay in your system until tomorrow so for the next 24 hours you should not:  Drive an automobile Make any legal decisions Drink any alcoholic beverage   You may resume regular meals tomorrow.  Today it is better to start with liquids and gradually work up to solid foods.  You may eat anything you prefer, but it is better to start with liquids, then soup and crackers, and gradually work up to solid foods.   Please notify your doctor immediately if you have any unusual bleeding, trouble breathing, redness and pain at the surgery site, drainage, fever, or pain not relieved by medication.    Additional Instructions:   Please contact your physician with any problems or Same Day Surgery at [phone], Monday through Friday 6 am to 4 pm, or [place] [facility] [place] [facility] number at [phone].  

## 2021-03-11 ENCOUNTER — Encounter: Payer: Self-pay | Admitting: Urology

## 2021-03-29 ENCOUNTER — Encounter: Payer: Self-pay | Admitting: Urology

## 2021-03-31 ENCOUNTER — Encounter: Payer: Self-pay | Admitting: *Deleted

## 2021-04-04 NOTE — Progress Notes (Signed)
   04/05/21    HPI: Jeffery Everett is a 25 y.o.male with a personal history of nephrolithiasis, who presents today for a sten removal.   On 03/10/2021 he underwent a cystoscopy/ ureteroscopy/holmium laser/ stent exchange for left ureteral stricture and left ureteral calculus. Intraoperative findings were persistent severe distal ureteral stricture just above the level of the iliacs with proximal dilation, very difficult endoscopic manipulation.  Ultimately, stone identified and fragmented did into 5 or 6 smaller pieces.  A few of them were able to be extracted via nitinol basket but there was some residual smaller fragments that retropulsed, likely less than a millimeter each.  7 French stent replaced for passive ureteral dilation.      Vitals:   04/05/21 1127  BP: 125/83  Pulse: 81   NED. A&Ox3.   No respiratory distress   Abd soft, NT, ND Normal external genitalia with patent urethral meatus   Cystoscopy/ Stent removal procedure  Patient identification was confirmed, informed consent was obtained, and patient was prepped using Betadine solution.  Lidocaine jelly was administered per urethral meatus.    Preoperative abx where received prior to procedure.    Procedure: - Flexible cystoscope introduced, without any difficulty.   - Thorough search of the bladder revealed:    normal urethral meatus  Stent seen emanating from left ureteral orifice, grasped with stent graspers, and removed in entirety.    Moderate debris in bladder limiting visualization.  Post-Procedure: - Patient tolerated the procedure well -ppx abx given today x 1 -warning symptoms reviewed -f/u 4 weeks with RUS   Karsten Ro Welles Walthall,acting as a scribe for [name], MD.,have documented all relevant documentation on the behalf of [name], MD,as directed by  [name], MD while in the presence of [name], MD.  I have reviewed the above documentation for accuracy and completeness,  and I agree with the above.   [name], MD

## 2021-04-05 ENCOUNTER — Encounter: Payer: Self-pay | Admitting: Urology

## 2021-04-05 ENCOUNTER — Ambulatory Visit (INDEPENDENT_AMBULATORY_CARE_PROVIDER_SITE_OTHER): Payer: BC Managed Care – PPO | Admitting: Urology

## 2021-04-05 ENCOUNTER — Other Ambulatory Visit: Payer: Self-pay

## 2021-04-05 VITALS — BP 125/83 | HR 81 | Ht 74.0 in | Wt 320.0 lb

## 2021-04-05 DIAGNOSIS — N201 Calculus of ureter: Secondary | ICD-10-CM | POA: Diagnosis not present

## 2021-04-05 MED ORDER — CEPHALEXIN 250 MG PO CAPS
500.0000 mg | ORAL_CAPSULE | Freq: Once | ORAL | Status: AC
  Administered 2021-04-05: 500 mg via ORAL

## 2021-04-06 LAB — URINALYSIS, COMPLETE
Bilirubin, UA: NEGATIVE
Glucose, UA: NEGATIVE
Ketones, UA: NEGATIVE
Nitrite, UA: NEGATIVE
Specific Gravity, UA: 1.02 (ref 1.005–1.030)
Urobilinogen, Ur: 0.2 mg/dL (ref 0.2–1.0)
pH, UA: 7.5 (ref 5.0–7.5)

## 2021-04-06 LAB — MICROSCOPIC EXAMINATION: RBC, Urine: >30 /hpf — AB (ref 0–2)

## 2021-04-07 ENCOUNTER — Telehealth: Payer: Self-pay | Admitting: Urology

## 2021-04-07 NOTE — Telephone Encounter (Signed)
Spoke with patient he is experiencing intermittent left flank pain with brief intense 8/10 moments of spasm pain. He is utilizing OTC NSAIDs with some relief. He was encouraged to restart Flomax for a few days to help aid spasm discomfort and increase water intake. Denies fever, chills and vomiting. He states he is slightly nauseated but he states this may be due to lack of appetite. He was told to monitor symptoms for now if pain becomes more intense/constant or develops fever/chills he should call back for further evaluation. Patient verbalized understanding.

## 2021-04-07 NOTE — Telephone Encounter (Signed)
Pt had stent removed this past Tuesday.  He's been feeling severe pain in his left side.  He has had a little nausea.

## 2021-04-29 ENCOUNTER — Encounter: Payer: Self-pay | Admitting: *Deleted

## 2021-05-03 ENCOUNTER — Other Ambulatory Visit: Payer: Self-pay

## 2021-05-03 ENCOUNTER — Ambulatory Visit: Payer: BC Managed Care – PPO | Admitting: Urology

## 2021-05-17 ENCOUNTER — Ambulatory Visit: Payer: BC Managed Care – PPO | Admitting: Urology

## 2021-05-20 ENCOUNTER — Ambulatory Visit
Admission: RE | Admit: 2021-05-20 | Discharge: 2021-05-20 | Disposition: A | Discharge status: Home / Self Care | Payer: BC Managed Care – PPO | Source: Ambulatory Visit | Attending: Urology | Admitting: Urology

## 2021-05-20 ENCOUNTER — Other Ambulatory Visit: Payer: Self-pay

## 2021-05-20 DIAGNOSIS — N201 Calculus of ureter: Secondary | ICD-10-CM | POA: Diagnosis present

## 2021-05-23 NOTE — Progress Notes (Signed)
05/24/21 2:59 PM   [name] [birth year] [id number]  Referring provider:  No referring provider defined for this encounter. Chief Complaint  Patient presents with   Results    4-6wk RUS     HPI: Jeffery Everett is a 25 y.o.male with a personal history of nephrolithiasis, who presents today for 4-6 week follow-up with RUS.   On 03/10/2021 he underwent a cystoscopy/ ureteroscopy/holmium laser/ stent exchange for left ureteral stricture and left ureteral calculus. Stent was removed on 04/07/2021.   RUS persistent hydronephrosis on the left.  He is doing well besides issues with his sinuses. He reports he has increased his water intake   PMH: Past Medical History:  Diagnosis Date   ADHD (attention deficit hyperactivity disorder)    History of kidney stones     Surgical History: Past Surgical History:  Procedure Laterality Date   CYSTOSCOPY/URETEROSCOPY/HOLMIUM LASER/STENT PLACEMENT Left 09/04/2019   Procedure: CYSTOSCOPY/URETEROSCOPY/HOLMIUM LASER/STENT PLACEMENT;  Surgeon: [name], MD;  Location: [facility];  Service: Urology;  Laterality: Left;   CYSTOSCOPY/URETEROSCOPY/HOLMIUM LASER/STENT PLACEMENT Left 02/25/2021   Procedure: CYSTOSCOPY/URETEROSCOPY/HOLMIUM LASER/STENT PLACEMENT;  Surgeon: [name], MD;  Location: [facility];  Service: Urology;  Laterality: Left;  Holium   CYSTOSCOPY/URETEROSCOPY/HOLMIUM LASER/STENT PLACEMENT Left 03/10/2021   Procedure: CYSTOSCOPY/URETEROSCOPY/HOLMIUM LASER/STENT EXCHANGE;  Surgeon: [name], MD;  Location: [facility];  Service: Urology;  Laterality: Left;   EXTRACORPOREAL SHOCK WAVE LITHOTRIPSY Left 01/08/2020   Procedure: EXTRACORPOREAL SHOCK WAVE LITHOTRIPSY (ESWL);  Surgeon: Sondra Come, MD;  Location: [facility];  Service: Urology;  Laterality: Left;    Home Medications:  Allergies as of 05/24/2021   No Known Allergies      Medication List        Accurate as of May 24, 2021  2:59 PM. If you  have any questions, ask your nurse or doctor.          STOP taking these medications    diazepam 10 MG tablet Commonly known as: VALIUM Stopped by: [name], MD       TAKE these medications    diphenhydrAMINE 25 MG tablet Commonly known as: BENADRYL Take 25 mg by mouth every 6 (six) hours as needed for allergies.        Allergies: No Known Allergies  Family History: Family History  Problem Relation Age of Onset   Hypertension Mother    ADD / ADHD Father    Hypertension Paternal Grandmother    Bipolar disorder Paternal Grandmother    Fibromyalgia Paternal Grandmother    COPD Paternal Grandmother    Heart disease Paternal Grandfather     Social History:  reports that he has never smoked. He has never used smokeless tobacco. He reports current alcohol use of about 1.0 standard drink per week. He reports current drug use. Drug: Morphine.   Physical Exam: BP 132/86   Pulse 86   Ht 6\' 2"  (1.88 m)   Wt (!) 317 lb (143.8 kg)   BMI 40.70 kg/m   Constitutional:  Alert and oriented, No acute distress. HEENT: [place] AT, moist mucus membranes.  Trachea midline, no masses. Cardiovascular: No clubbing, cyanosis, or edema. Respiratory: Normal respiratory effort, no increased work of breathing. Skin: No rashes, bruises or suspicious lesions. Neurologic: Grossly intact, no focal deficits, moving all 4 extremities. Psychiatric: Normal mood and affect.  Laboratory Data:  Lab Results  Component Value Date   CREATININE 1.45 (H) 02/15/2021    Lab Results  Component Value Date   HGBA1C 5.5 07/30/2019  Pertinent Imaging: RUS revealed left hydronephrosis    Assessment & Plan:    Hydronephrosis  - persistent  - concerning for possible underlying stricture based on his history  - Recommend lasix renal study, study was described in detail. He is agreeable to this plan.    Follow-up with lasix renal study results   I,Kailey Littlejohn,acting as a scribe for  [name], MD.,have documented all relevant documentation on the behalf of [name], MD,as directed by  [name], MD while in the presence of [name], MD.  I have reviewed the above documentation for accuracy and completeness, and I agree with the above.   [name], MD   [facility] [address] [place], [place] [address] [phone]

## 2021-05-24 ENCOUNTER — Ambulatory Visit (INDEPENDENT_AMBULATORY_CARE_PROVIDER_SITE_OTHER): Payer: BC Managed Care – PPO | Admitting: Urology

## 2021-05-24 ENCOUNTER — Encounter: Payer: Self-pay | Admitting: Urology

## 2021-05-24 ENCOUNTER — Other Ambulatory Visit: Payer: Self-pay

## 2021-05-24 VITALS — BP 132/86 | HR 86 | Ht 74.0 in | Wt 317.0 lb

## 2021-05-24 DIAGNOSIS — N1330 Unspecified hydronephrosis: Secondary | ICD-10-CM

## 2021-05-24 NOTE — Patient Instructions (Signed)
Renal Scan A renal scan is a procedure that is used to check for problems in the kidneys. The kidneys are the organs that help filter blood and keep it clean. They move waste out of the blood and into the urine so the waste can be removed from the body. In this procedure, a small amount of radioactive material (tracer) is injected into your blood. The tracer will travel through your bloodstream and reach your kidneys. A scanner with a camera that detects the tracer is used to examine the kidneys. The tracer makes it easy for your health care provider to see problems, if there are any. There are several types of renal scans: Renal structural scan. This type is used to check for problems that may change the normal structure of the kidney. These problems include cysts, tumors, collections of pus (abscesses), and certain disorders that are present at birth (are congenital). Renal function scan. This type is used to check for problems that affect kidney function, such as inflammation, low blood supply, or kidney failure. Renal blood flow scan. This type is used to evaluate the blood flow to each kidney. It can help to find any narrowing of the arteries that carry blood to the kidneys (renal artery stenosis). Renal hypertension scan. This type is used to identify the cause of renovascular hypertension. This is high blood pressure that results from problems in the renal arteries. This scan can help to find any narrowing or blockages in these arteries. Renal obstruction scan. This type is used to find any blockage in the area where waste moves out of the kidneys and into the urinary tract. Tell a health care provider about: Any allergies you have. All medicines you are taking, including vitamins, herbs, eye drops, creams, and over-the-counter medicines. Any blood disorders you have. Any surgeries you have had. Any medical conditions you have. Whether you are breastfeeding. Whether you are pregnant or may be  pregnant. What are the risks? Generally, this is a safe procedure. However, problems may occur, including: Exposure to radiation (a small amount). Allergic reaction to the radioactive material. This is rare. What happens before the procedure? You may be asked to drink extra fluids for 24 hours before your exam. Follow your health care provider's instructions. Ask your health care provider about: Changing or stopping your regular medicines. This is especially important if you are taking diabetes medicines or blood thinners. Taking medicines such as aspirin and ibuprofen. These medicines can thin your blood. They also can affect kidney function and may interfere with the results of your procedure. Do not take these medicines unless your health care provider tells you to take them. Taking over-the-counter medicines, vitamins, herbs, and supplements. What happens during the procedure?  You will lie on a scanner table. An IV line will be inserted into one of your veins. The IV line will remain in place for the entire exam. A small amount of radioactive tracer will be injected through the IV line. Images will be taken of your kidney area. The images will be taken at different intervals depending on what is being checked. In some cases, a second type of tracer or a medicine may be injected. Then, more scans of your kidney or kidneys will be done. The procedure may vary among health care providers and hospitals. What can I expect after the procedure? After your procedure, the radioactive tracer will leave your body over a few days. It is up to you to get the results of your procedure.  Ask your health care provider, or the department that is doing the procedure, when your results will be ready. Also ask: How will I get my results? What are my treatment options? What other testing do I need? What are my next steps? Talk with your health care provider about what your results mean. Follow these  instructions at home: Drink enough fluid to keep your urine pale yellow. This will help flush the tracer out of your body. Take over-the-counter and prescription medicines only as told by your health care provider. Return to your normal activities and your normal diet as told by your health care provider. Summary A renal scan is a procedure that is used to check for problems in your kidneys. The kidneys are the organs that help filter blood and keep it clean. A renal scan can help look for cysts, tumors, or collections of pus (abscesses) in the kidneys and can help check blood flow to each kidney. Tell your health care provider about any prescription or over-the-counter medicines, vitamins, herbs, and supplements that you are taking. Some of these can affect kidney function and may interfere with the results of your procedure. Talk with your health care provider about what your results mean. This information is not intended to replace advice given to you by your health care provider. Make sure you discuss any questions you have with your health care provider. Document Revised: 10/03/2019 Document Reviewed: 10/03/2019 Elsevier Patient Education  2022 [employer].

## 2021-06-11 IMAGING — CR DG ABDOMEN 1V
1 series · 2 of 2 positions shown · non-contrast
Comparison: 05/25/2020

CLINICAL DATA: Nephrolithiasis left-sided pain

EXAM:
ABDOMEN - 1 VIEW

[Series 1: dg abd 1 view · 0.14mm/px · 2 of 2 slices shown]
[im 1/2]
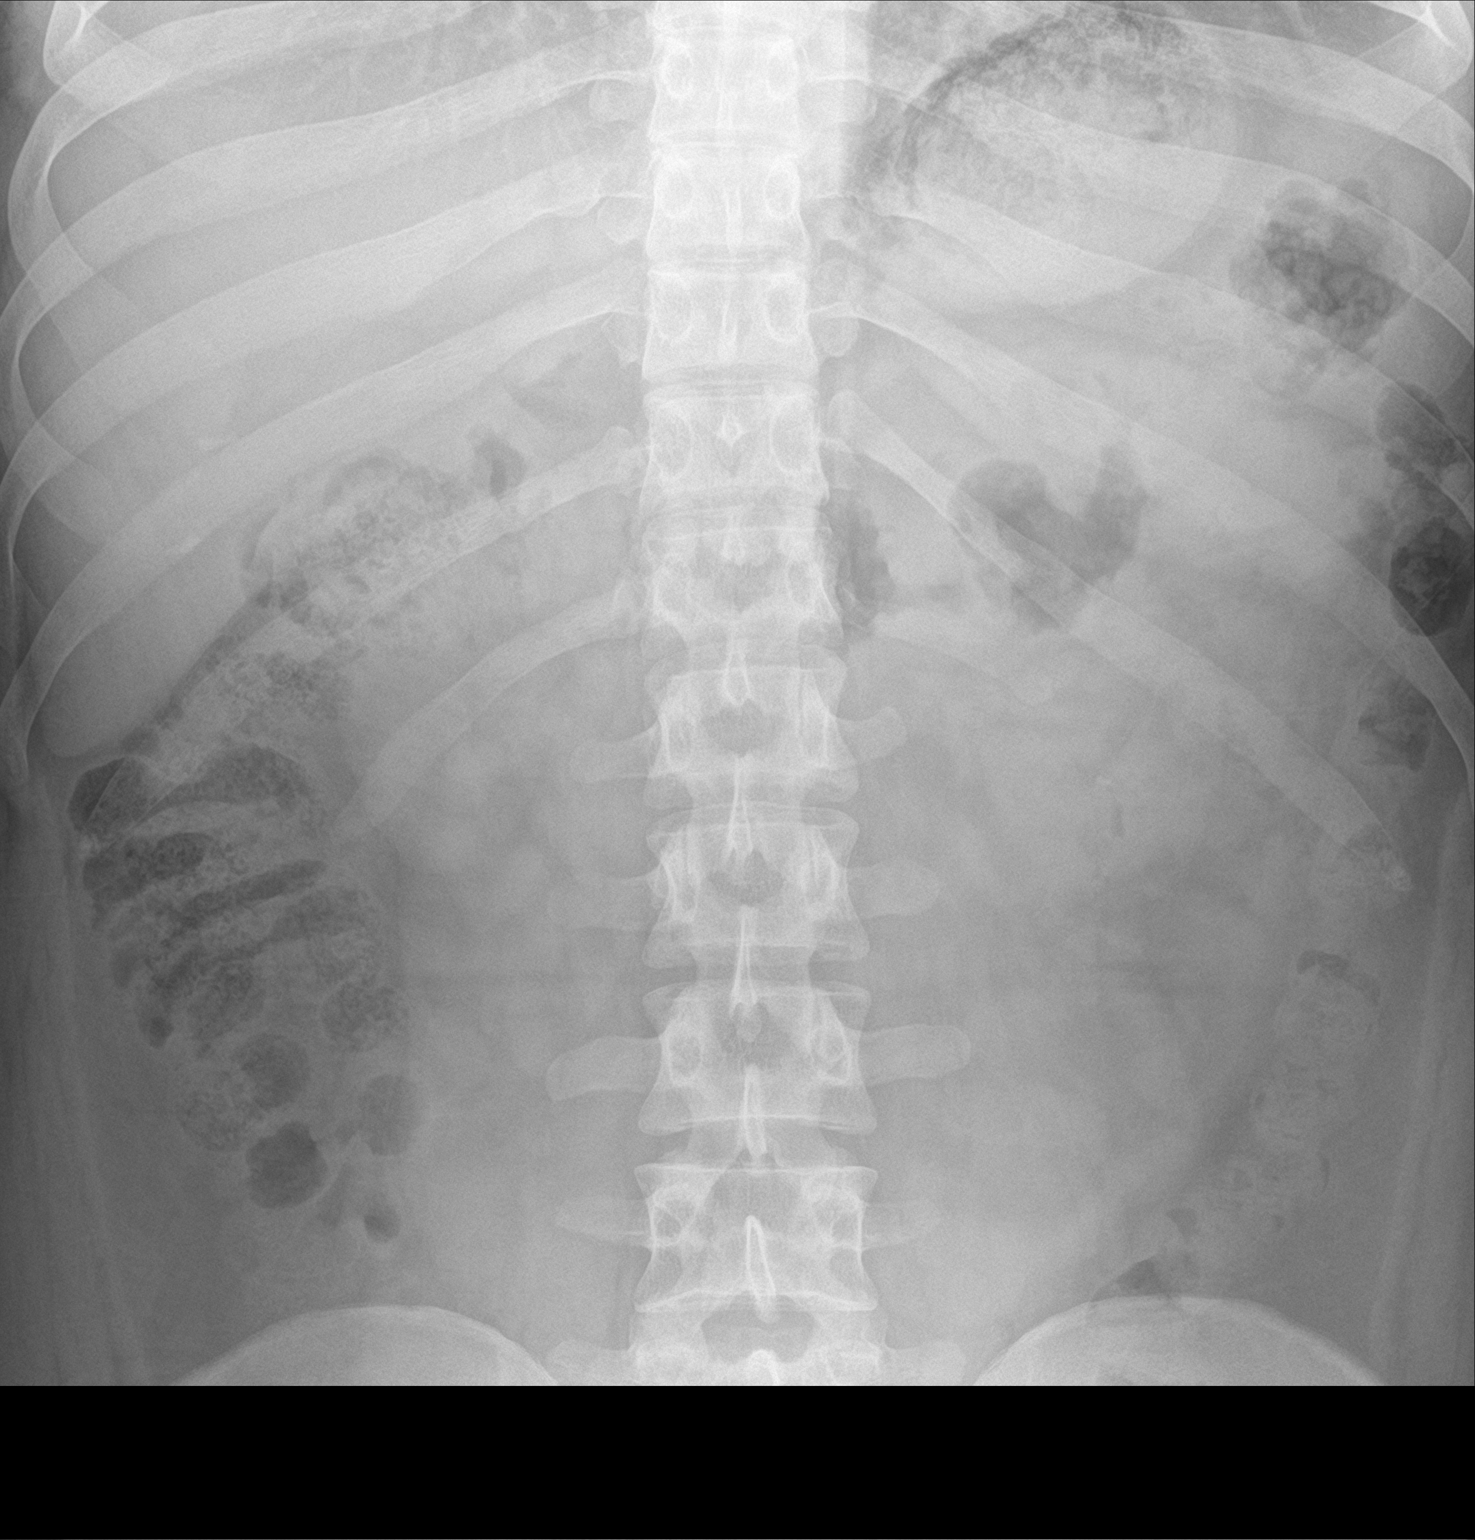
[im 2/2]
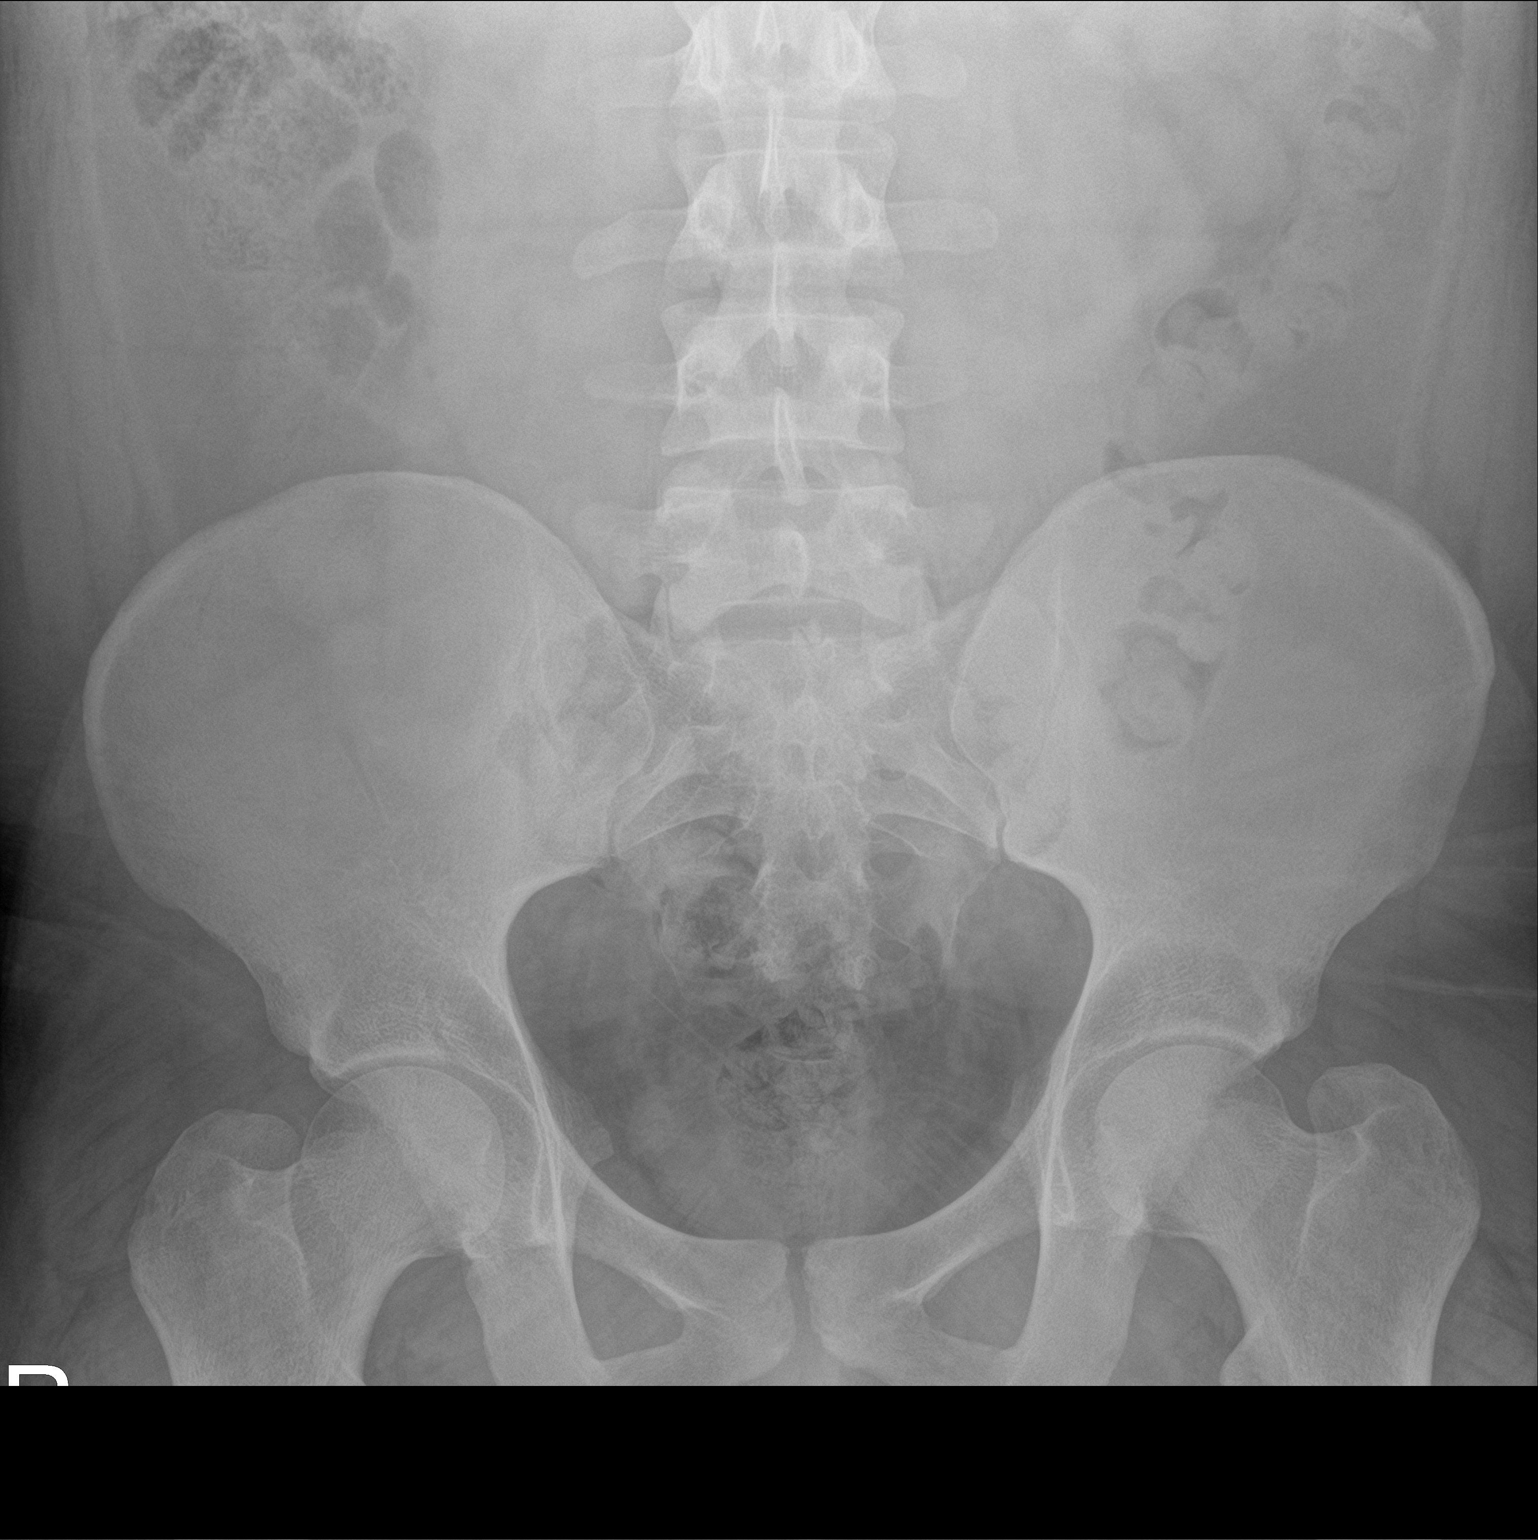

[2 of 2 positions shown; findings below may reference images not displayed]

FINDINGS: Nonobstructed bowel-gas pattern. Possible faint 2 mm stone over
lower left kidney. Moderate stool in the colon
IMPRESSION: Possible faint 2 mm stone over the lower left kidney.

## 2021-06-14 ENCOUNTER — Ambulatory Visit: Admission: RE | Admit: 2021-06-14 | Payer: BC Managed Care – PPO | Source: Ambulatory Visit

## 2021-06-21 ENCOUNTER — Ambulatory Visit: Payer: BC Managed Care – PPO | Admitting: Urology

## 2021-06-26 NOTE — Progress Notes (Signed)
06/28/21 5:00 PM   Jeffery Everett [birth year] [id number]  Referring provider:  No referring provider defined for this encounter. No chief complaint on file.    HPI: Jeffery Everett is a 25 y.o.male with a personal history of nephrolithiasis, who presents today for 4 week follow-up with Lasix renal scan results.   On 03/10/2021 he underwent a cystoscopy/ ureteroscopy/holmium laser/ stent exchange for left ureteral stricture and left ureteral calculus. Stent was removed on 04/07/2021.  Notably, the stone was heavily impacted.  Is a difficult ureteroscopy's in the past as well.   RUS persistent hydronephrosis on the left.  Lasix renal study revealed mild partial obstructive hydronephrosis of the LEFT kidney. Mild postvoid residual.  He denies any new urinary symptoms and any pain.    PMH: Past Medical History:  Diagnosis Date   ADHD (attention deficit hyperactivity disorder)    History of kidney stones     Surgical History: Past Surgical History:  Procedure Laterality Date   CYSTOSCOPY/URETEROSCOPY/HOLMIUM LASER/STENT PLACEMENT Left 09/04/2019   Procedure: CYSTOSCOPY/URETEROSCOPY/HOLMIUM LASER/STENT PLACEMENT;  Surgeon: [name], MD;  Location: [facility];  Service: Urology;  Laterality: Left;   CYSTOSCOPY/URETEROSCOPY/HOLMIUM LASER/STENT PLACEMENT Left 02/25/2021   Procedure: CYSTOSCOPY/URETEROSCOPY/HOLMIUM LASER/STENT PLACEMENT;  Surgeon: [name], MD;  Location: [facility];  Service: Urology;  Laterality: Left;  Holium   CYSTOSCOPY/URETEROSCOPY/HOLMIUM LASER/STENT PLACEMENT Left 03/10/2021   Procedure: CYSTOSCOPY/URETEROSCOPY/HOLMIUM LASER/STENT EXCHANGE;  Surgeon: [name], MD;  Location: [facility];  Service: Urology;  Laterality: Left;   EXTRACORPOREAL SHOCK WAVE LITHOTRIPSY Left 01/08/2020   Procedure: EXTRACORPOREAL SHOCK WAVE LITHOTRIPSY (ESWL);  Surgeon: Sondra Come, MD;  Location: [facility];  Service: Urology;  Laterality: Left;    Home  Medications:  Allergies as of 06/28/2021   No Known Allergies      Medication List        Accurate as of June 28, 2021  5:00 PM. If you have any questions, ask your nurse or doctor.          diphenhydrAMINE 25 MG tablet Commonly known as: BENADRYL Take 25 mg by mouth every 6 (six) hours as needed for allergies.        Allergies: No Known Allergies  Family History: Family History  Problem Relation Age of Onset   Hypertension Mother    ADD / ADHD Father    Hypertension Paternal Grandmother    Bipolar disorder Paternal Grandmother    Fibromyalgia Paternal Grandmother    COPD Paternal Grandmother    Heart disease Paternal Grandfather     Social History:  reports that he has never smoked. He has never used smokeless tobacco. He reports current alcohol use of about 1.0 standard drink per week. He reports current drug use. Drug: Morphine.   Physical Exam: BP 122/83   Pulse 82   Ht 6\' 2"  (1.88 m)   Wt (!) 317 lb (143.8 kg)   BMI 40.70 kg/m   Constitutional:  Alert and oriented, No acute distress. HEENT: [place] AT, moist mucus membranes.  Trachea midline, no masses. Cardiovascular: No clubbing, cyanosis, or edema. Respiratory: Normal respiratory effort, no increased work of breathing. Skin: No rashes, bruises or suspicious lesions. Neurologic: Grossly intact, no focal deficits, moving all 4 extremities. Psychiatric: Normal mood and affect.  Laboratory Data:  Lab Results  Component Value Date   CREATININE 1.45 (H) 02/15/2021    Lab Results  Component Value Date   HGBA1C 5.5 07/30/2019    Urinalysis   Pertinent Imaging: CLINICAL DATA:  LEFT hydronephrosis.  EXAM: NUCLEAR MEDICINE RENAL SCAN WITH DIURETIC ADMINISTRATION   TECHNIQUE: Radionuclide angiographic and sequential renal images were obtained after intravenous injection of radiopharmaceutical. Imaging was continued during slow intravenous injection of Lasix approximately 15 minutes after the  start of the examination.   RADIOPHARMACEUTICALS:  4.7 mCi Technetium-96m MAG3 IV   COMPARISON:  None.   FINDINGS: Flow:  Prompt symmetric arterial flow to the kidneys.   Left renogram: Prompt renal coil cortical uptake. Mild delay in excretion into the LEFT renal pelvis compared to the RIGHT. Lasix augments clearing of counts from the LEFT renal pelvis. The LEFT renal pelvis mildly patulous. Mild postvoid residual on the LEFT.   Right renogram: Uniform uptake of counts in the renal cortex. Counts are promptly excreted into the collecting system and cleared prior to administration of Lasix. Lasix augment clearance. No postvoid residual.   Differential:   Left kidney = 44.5 %   Right kidney = 55.5 %   T1/2 post Lasix :   Left kidney = 20 min   Right kidney = 10 min   IMPRESSION: 1. Mild partial obstructive hydronephrosis of the LEFT kidney. Mild postvoid residual. 2. Normal RIGHT kidney.     Electronically Signed   By: Suzy Bouchard M.D.   On: 06/28/2021 14:59  The above study was personally reviewed.  Agree with radiologic interpretation.  Equivocal to borderline abnormal radiotracer excretion on the left as compared to the right consistent with very mild obstruction.  Assessment & Plan:    Left distal urethral stricture  - likely secondary to many surgeries and ureteral impaction  -Currently asymptomatic - He still falls into the equivocal range with only mild obstruction on the side which falls into the clavicle 10 to 20-minute range but borderline abnormal -We discussed the consequences of urinary obstruction including the loss of renal function over time especially for high-grade obstruction exists -Given that the obstruction is relatively mild, recommend consideration of more conservative approach rather than ureteral reconstruction with serial imaging and renal function, repeat basic renogram down the road if worsens or if he develops any signs or symptoms.   He is more interested in a conservative approach as well. - Will monitor with RUS / Cr -Warning symptoms reviewed  Return in about 6 months (around 12/27/2021) for BMP, Renal US.  Conley Rolls as a Education administrator for Hollice Espy, MD.,have documented all relevant documentation on the behalf of Hollice Espy, MD,as directed by  Hollice Espy, MD while in the presence of Hollice Espy, MD.  I have reviewed the above documentation for accuracy and completeness, and I agree with the above.   Hollice Espy, MD   [facility] [address] [place], [place] [address] [phone]

## 2021-06-27 ENCOUNTER — Other Ambulatory Visit: Payer: Self-pay

## 2021-06-27 ENCOUNTER — Ambulatory Visit
Admission: RE | Admit: 2021-06-27 | Discharge: 2021-06-27 | Disposition: A | Discharge status: Home / Self Care | Payer: BC Managed Care – PPO | Source: Ambulatory Visit | Attending: Urology | Admitting: Urology

## 2021-06-27 DIAGNOSIS — N1330 Unspecified hydronephrosis: Secondary | ICD-10-CM | POA: Insufficient documentation

## 2021-06-27 MED ORDER — FUROSEMIDE 10 MG/ML IJ SOLN
60.0000 mg | Freq: Once | INTRAMUSCULAR | Status: DC
  Filled 2021-06-27: qty 6

## 2021-06-27 MED ORDER — TECHNETIUM TC 99M MERTIATIDE
5.4700 | Freq: Once | INTRAVENOUS | Status: AC | PRN
  Administered 2021-06-27: 5.47 via INTRAVENOUS

## 2021-06-28 ENCOUNTER — Ambulatory Visit (INDEPENDENT_AMBULATORY_CARE_PROVIDER_SITE_OTHER): Payer: BC Managed Care – PPO | Admitting: Urology

## 2021-06-28 ENCOUNTER — Encounter: Payer: Self-pay | Admitting: Urology

## 2021-06-28 VITALS — BP 122/83 | HR 82 | Ht 74.0 in | Wt 317.0 lb

## 2021-06-28 DIAGNOSIS — N201 Calculus of ureter: Secondary | ICD-10-CM | POA: Diagnosis not present

## 2021-09-21 ENCOUNTER — Ambulatory Visit: Payer: BC Managed Care – PPO | Admitting: Urology

## 2021-09-24 ENCOUNTER — Other Ambulatory Visit: Payer: Self-pay

## 2021-09-24 ENCOUNTER — Emergency Department
Admission: EM | Admit: 2021-09-24 | Discharge: 2021-09-24 | Disposition: A | Discharge status: Home / Self Care | Payer: BC Managed Care – PPO | Attending: Emergency Medicine | Admitting: Emergency Medicine

## 2021-09-24 ENCOUNTER — Emergency Department: Payer: BC Managed Care – PPO

## 2021-09-24 DIAGNOSIS — S8991XA Unspecified injury of right lower leg, initial encounter: Secondary | ICD-10-CM | POA: Insufficient documentation

## 2021-09-24 DIAGNOSIS — X58XXXA Exposure to other specified factors, initial encounter: Secondary | ICD-10-CM | POA: Insufficient documentation

## 2021-09-24 DIAGNOSIS — Y990 Civilian activity done for income or pay: Secondary | ICD-10-CM | POA: Insufficient documentation

## 2021-09-24 MED ORDER — IBUPROFEN 800 MG PO TABS: 800.0000 mg | ORAL_TABLET | Freq: Three times a day (TID) | ORAL | 0 refills | Status: AC | PRN

## 2021-09-24 NOTE — ED Triage Notes (Signed)
Pt states he works at [employer], was bent over at knees to [employer] and was not able to bear weight on right knee when attmepting to stand back up.  ?

## 2021-09-24 NOTE — ED Provider Notes (Signed)
? ?[facility] ?Provider Note ? ? ? Event Date/Time  ? First MD Initiated Contact with Patient 09/24/21 0431   ?  (approximate) ? ? ?History  ? ?Knee Pain ? ? ?HPI ? ?Jeffery Everett is a 26 y.o. male with history of ADHD, obesity who presents to the emergency department right knee pain.  States he squatted down at work and felt a pop in the knee and had pain since.  He did not injure the knee in any other way or fall to the ground.  States he did have pain with ambulation but this is improving and has been able to ambulate.  No numbness, weakness, calf tenderness or calf swelling.  No other injury. ? ? ?History provided by patient. ? ? ? ?Past Medical History:  ?Diagnosis Date  ? ADHD (attention deficit hyperactivity disorder)   ? History of kidney stones   ? ? ?Past Surgical History:  ?Procedure Laterality Date  ? CYSTOSCOPY/URETEROSCOPY/HOLMIUM LASER/STENT PLACEMENT Left 09/04/2019  ? Procedure: CYSTOSCOPY/URETEROSCOPY/HOLMIUM LASER/STENT PLACEMENT;  Surgeon: [name], MD;  Location: [facility];  Service: Urology;  Laterality: Left;  ? CYSTOSCOPY/URETEROSCOPY/HOLMIUM LASER/STENT PLACEMENT Left 02/25/2021  ? Procedure: CYSTOSCOPY/URETEROSCOPY/HOLMIUM LASER/STENT PLACEMENT;  Surgeon: [name], MD;  Location: [facility];  Service: Urology;  Laterality: Left;  [place]  ? CYSTOSCOPY/URETEROSCOPY/HOLMIUM LASER/STENT PLACEMENT Left 03/10/2021  ? Procedure: CYSTOSCOPY/URETEROSCOPY/HOLMIUM LASER/STENT EXCHANGE;  Surgeon: [name], MD;  Location: [facility];  Service: Urology;  Laterality: Left;  ? EXTRACORPOREAL SHOCK WAVE LITHOTRIPSY Left 01/08/2020  ? Procedure: EXTRACORPOREAL SHOCK WAVE LITHOTRIPSY (ESWL);  Surgeon: Sondra Come, MD;  Location: [facility];  Service: Urology;  Laterality: Left;  ? ? ?MEDICATIONS:  ?Prior to Admission medications   ?Medication Sig Start Date End Date Taking? Authorizing Provider  ?diphenhydrAMINE (BENADRYL) 25 MG tablet Take 25 mg by mouth every 6  (six) hours as needed for allergies.    [provider]  ? ? ?Physical Exam  ? ?Triage Vital Signs: ?ED Triage Vitals  ?Enc Vitals Group  ?   BP 09/24/21 0152 120/88  ?   Pulse Rate 09/24/21 0152 84  ?   Resp 09/24/21 0152 17  ?   Temp 09/24/21 0152 98.3 ?F (36.8 ?C)  ?   Temp Source 09/24/21 0152 Oral  ?   SpO2 09/24/21 0152 97 %  ?   Weight 09/24/21 0045 300 lb (136.1 kg)  ?   Height 09/24/21 0045 6\' 2"  (1.88 m)  ?   Head Circumference --   ?   Peak Flow --   ?   Pain Score --   ?   Pain Loc --   ?   Pain Edu? --   ?   Excl. in GC? --   ? ? ?Most recent vital signs: ?Vitals:  ? 09/24/21 0152 09/24/21 0520  ?BP: 120/88   ?Pulse: 84   ?Resp: 17 20  ?Temp: 98.3 ?F (36.8 ?C)   ?SpO2: 97%   ? ? ? ?CONSTITUTIONAL: Alert and responds appropriately to questions. Well-appearing; well-nourished ?HEAD: Normocephalic, atraumatic ?EYES: Conjunctivae clear, pupils appear equal ?ENT: normal nose; moist mucous membranes ?NECK: Normal range of motion ?CARD: Regular rate and rhythm ?RESP: Normal chest excursion without splinting or tachypnea; no hypoxia or respiratory distress, speaking full sentences ?ABD/GI: non-distended ?EXT: Normal ROM in all joints, no major deformities noted, no ligamentous laxity noted to the right knee.  No joint effusion.  No redness or warmth.  No calf tenderness or calf swelling.  2+ right  DP pulse and normal capillary refill.  Compartments in the right leg are soft.  Normal sensation throughout the right lower extremity. ?SKIN: Normal color for age and race, no rashes on exposed skin ?NEURO: Moves all extremities equally, normal speech, no facial asymmetry noted ?PSYCH: The patient's mood and manner are appropriate. Grooming and personal hygiene are appropriate. ? ?ED Results / Procedures / Treatments  ? ?LABS: ?(all labs ordered are listed, but only abnormal results are displayed) ?Labs Reviewed - No data to display ? ? ?EKG: ? ?RADIOLOGY: ?My personal review and interpretation of imaging:  X-ray of the right knee shows no fracture or dislocation. ? ?I have personally reviewed all radiology reports. ?DG Knee Complete 4 Views Right ? ?Result Date: 09/24/2021 ?CLINICAL DATA:  Knee pain following bending, initial encounter EXAM: RIGHT KNEE - COMPLETE 4+ VIEW COMPARISON:  None. FINDINGS: No evidence of fracture, dislocation, or joint effusion. No evidence of arthropathy or other focal bone abnormality. Soft tissues are unremarkable. IMPRESSION: No acute abnormality noted. Electronically Signed   By: Alcide Clever M.D.   On: 09/24/2021 01:34   ? ? ?PROCEDURES: ? ?Critical Care performed: No ? ? ?CRITICAL CARE ?Performed by: Baxter Hire Zoii Florer ? ? ?Total critical care time: 0 minutes ? ?Critical care time was exclusive of separately billable procedures and treating other patients. ? ?Critical care was necessary to treat or prevent imminent or life-threatening deterioration. ? ?Critical care was time spent personally by me on the following activities: development of treatment plan with patient and/or surrogate as well as nursing, discussions with consultants, evaluation of patient's response to treatment, examination of patient, obtaining history from patient or surrogate, ordering and performing treatments and interventions, ordering and review of laboratory studies, ordering and review of radiographic studies, pulse oximetry and re-evaluation of patient's condition. ? ? ?Procedures ? ? ? ?IMPRESSION / MDM / ASSESSMENT AND PLAN / ED COURSE  ?I reviewed the triage vital signs and the nursing notes. ? ? ?Patient here after right knee injury.  Has been able to ambulate. ? ? ? ? ?DIFFERENTIAL DIAGNOSIS (includes but not limited to):   Right knee sprain, meniscal injury, ligamentous injury, fracture.  Clinically no sign of dislocation, septic arthritis, gout, DVT, arterial obstruction, compartment syndrome. ? ? ?PLAN: We will obtain x-ray of the right knee.  Have offered him pain medication which he  declines. ? ? ?MEDICATIONS GIVEN IN ED: ?Medications - No data to display ? ? ?ED COURSE: X-ray reviewed by myself and radiologist and shows no fracture or dislocation.  We will place him in a knee sleeve.  Have offered him crutches which he declines.  He states he has been able to ambulate.  Discussed supportive care instructions including alternating Tylenol and Motrin, rest, elevation and ice.  Will give orthopedic follow-up information if symptoms or not improving with conservative management over the next week.  Provided with work note. ? ?At this time, I do not feel there is any life-threatening condition present. I reviewed all nursing notes, vitals, pertinent previous records.  All lab and urine results, EKGs, imaging ordered have been independently reviewed and interpreted by myself.  I reviewed all available radiology reports from any imaging ordered this visit.  Based on my assessment, I feel the patient is safe to be discharged home without further emergent workup and can continue workup as an outpatient as needed. Discussed all findings, treatment plan as well as usual and customary return precautions with patient.  They verbalize understanding and are comfortable  with this plan.  Outpatient follow-up has been provided as needed.  All questions have been answered. ? ? ? ?CONSULTS: No emergent orthopedic consultation needed at this time.  X-rays unremarkable.  Neurovascular intact distally. ? ? ?OUTSIDE RECORDS REVIEWED: Reviewed patient's previous podiatry note with Dr. Ether Griffins on 02/15/2017. ? ? ? ? ? ? ? ? ?FINAL CLINICAL IMPRESSION(S) / ED DIAGNOSES  ? ?Final diagnoses:  ?Right knee injury, initial encounter  ? ? ? ?Rx / DC Orders  ? ?ED Discharge Orders   ? ?      Ordered  ?  ibuprofen (ADVIL) 800 MG tablet  Every 8 hours PRN       ? 09/24/21 0439  ? ?  ?  ? ?  ? ? ? ?Note:  This document was prepared using Dragon voice recognition software and may include unintentional dictation errors. ?  ?Quina Wilbourne,  Layla Maw, DO ?09/24/21 0540 ? ?

## 2021-09-24 NOTE — Discharge Instructions (Addendum)
You may alternate Tylenol 1000 mg every 6 hours as needed for pain, fever and Ibuprofen 800 mg every 6-8 hours as needed for pain, fever.  Please take Ibuprofen with food.  Do not take more than 4000 mg of Tylenol (acetaminophen) in a 24 hour period. ? ?Your x-ray showed no fracture or dislocation.  If your symptoms or not improving in 1 week with rest, elevation, ice, please follow-up with orthopedics. ? ?

## 2021-11-06 IMAGING — CT CT RENAL STONE PROTOCOL
2 of 4 series · 17 of 46 positions shown, 19 images · non-contrast
Comparison: 01/22/2021

CLINICAL DATA: Left flank pain and nausea. Nephrolithiasis.

EXAM:
CT ABDOMEN AND PELVIS WITHOUT CONTRAST
TECHNIQUE: Multidetector CT imaging of the abdomen and pelvis was performed
following the standard protocol without IV contrast.

[Series 2: renal stone 5.00 br40 s3 axial · axial · 0.91mm/px · z∈[+1220,+1705]mm · 14 of 107 slices shown, 16 images]
[im 5/107  soft-tissue]
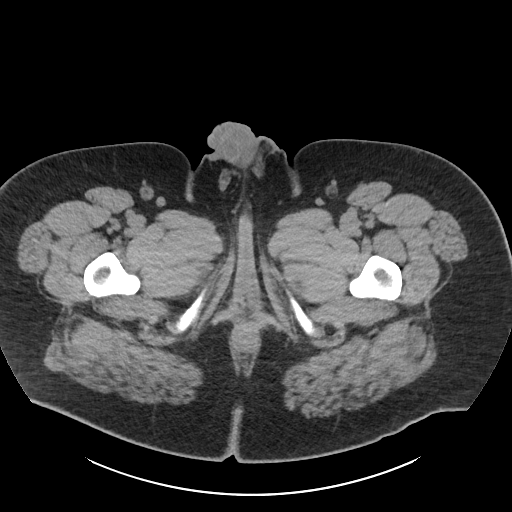
[im 5/107  bone]
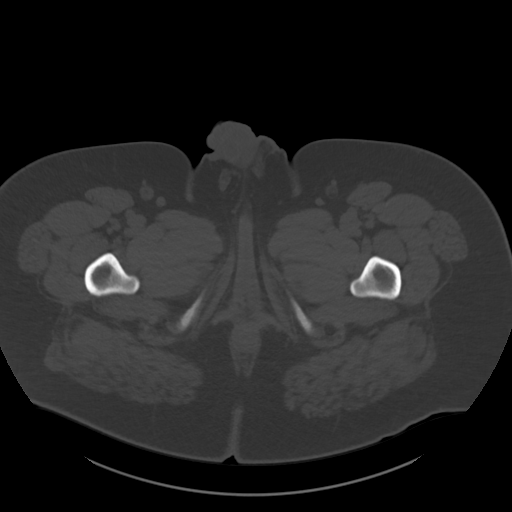
[im 13/107  soft-tissue]
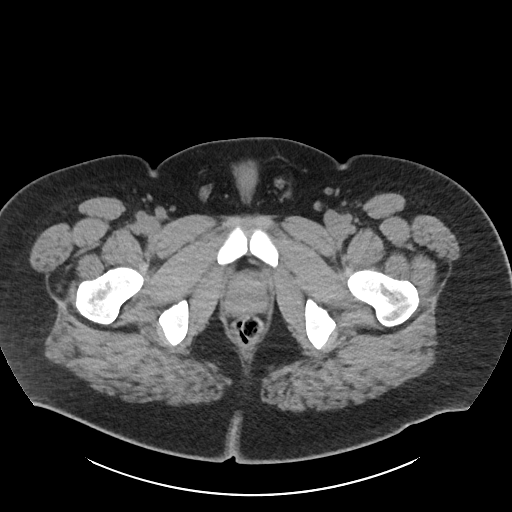
[im 22/107  soft-tissue]
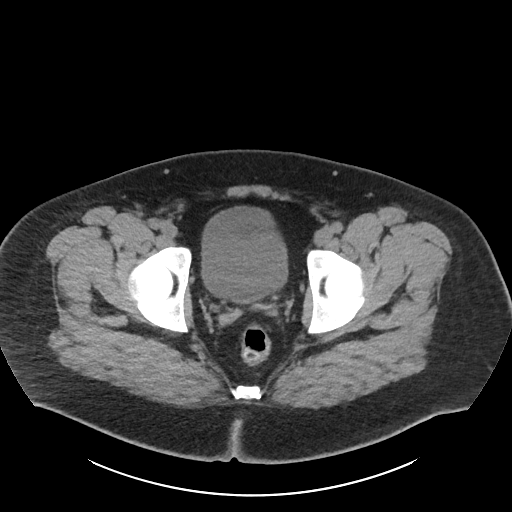
[im 30/107  soft-tissue]
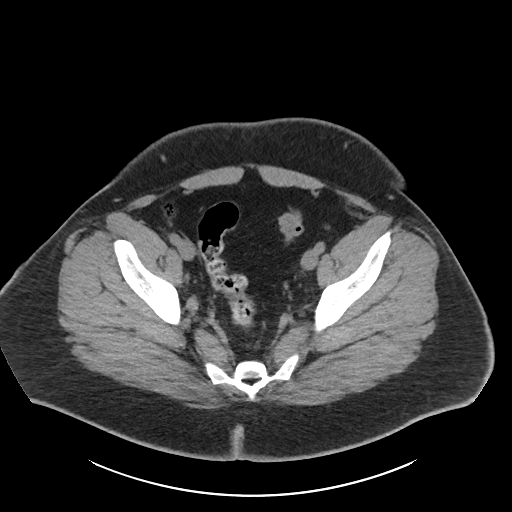
[im 34/107  soft-tissue]
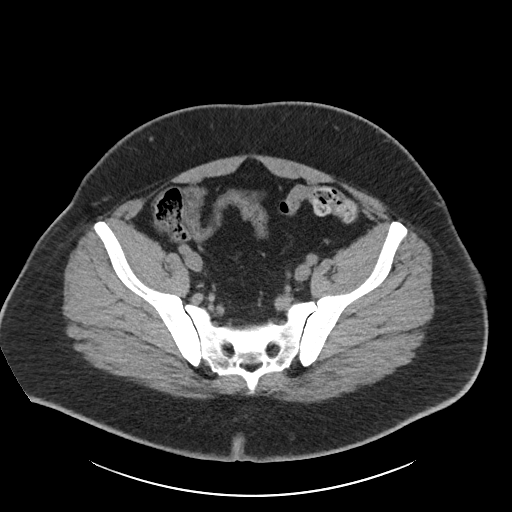
[im 43/107  soft-tissue]
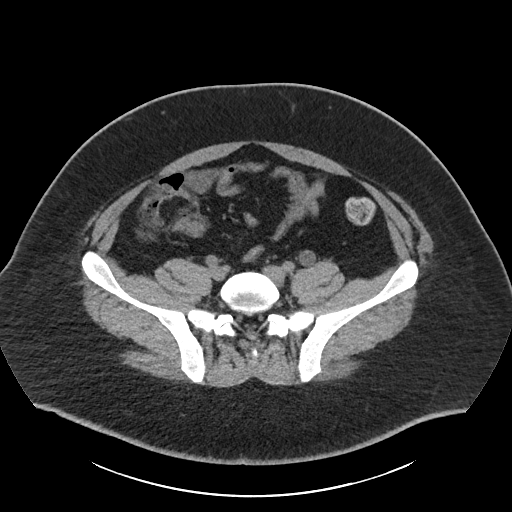
[im 51/107  soft-tissue]
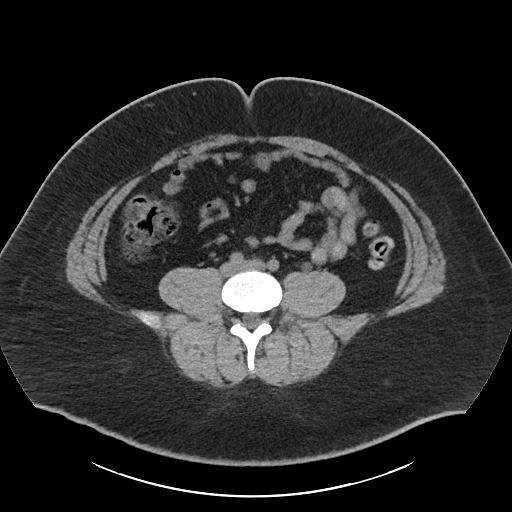
[im 56/107  soft-tissue]
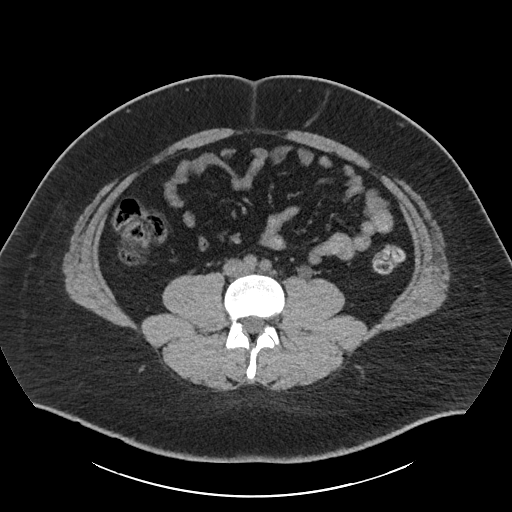
[im 64/107  soft-tissue]
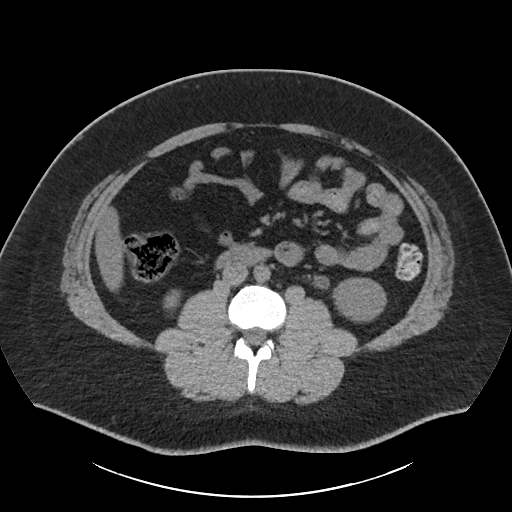
[im 64/107  bone]
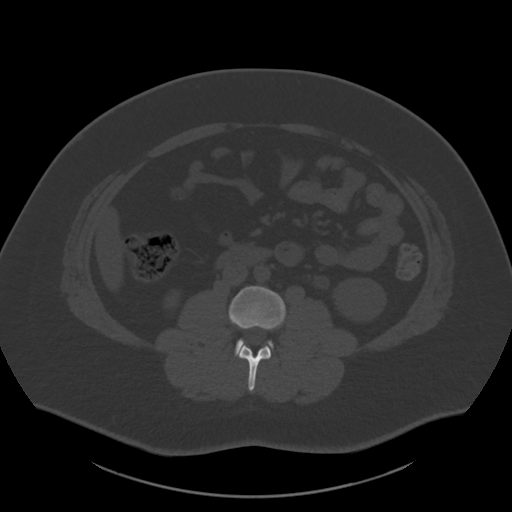
[im 73/107  soft-tissue]
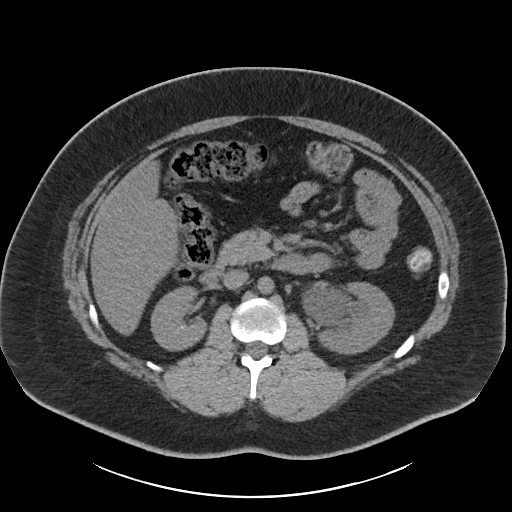
[im 81/107  soft-tissue]
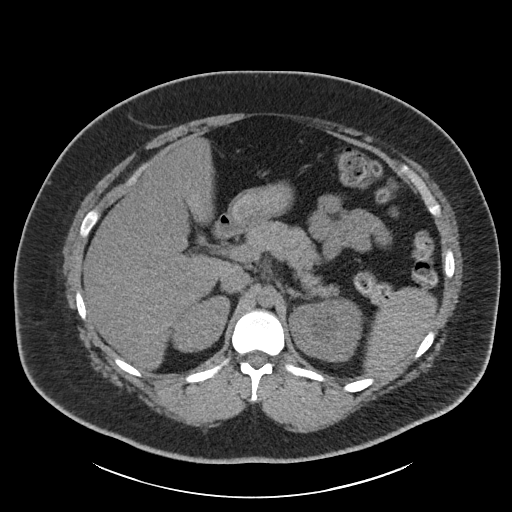
[im 85/107  soft-tissue]
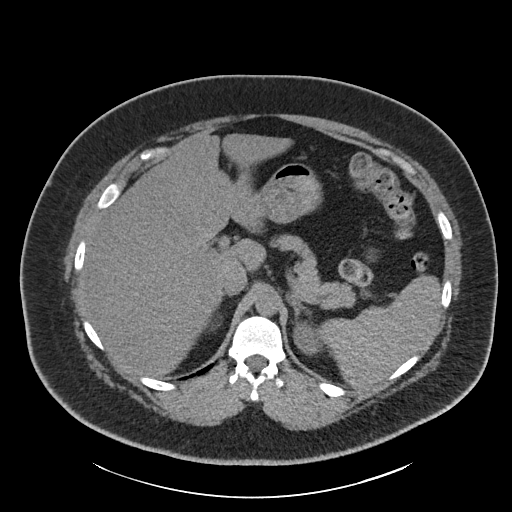
[im 94/107  soft-tissue]
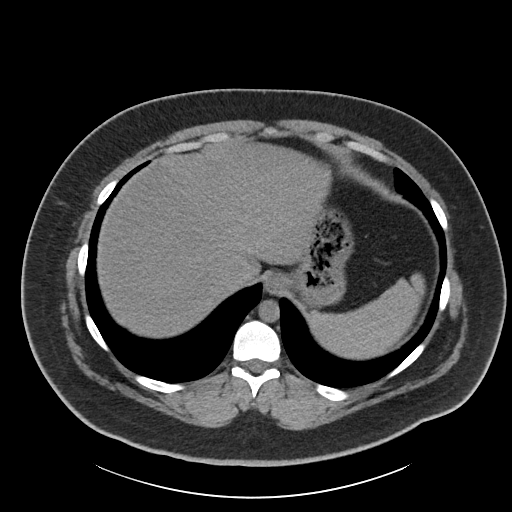
[im 102/107  soft-tissue]
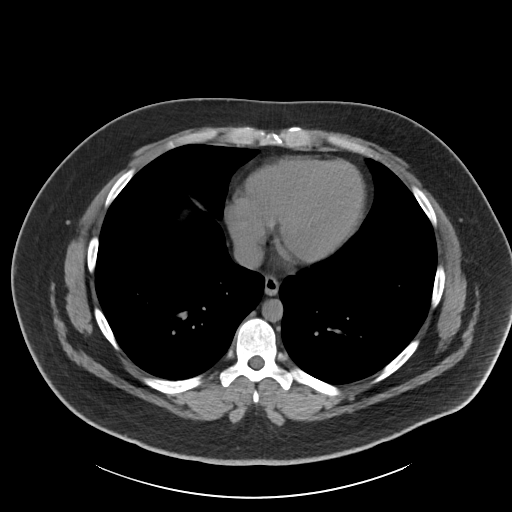

[Series 6: renal stone 2.00 br40 s3 cor · coronal · 0.91mm/px · 3 of 233 slices shown]
[im 78/233  soft-tissue]
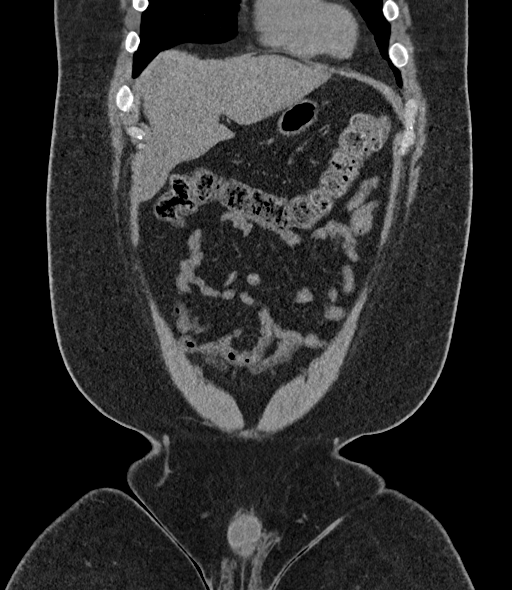
[im 104/233  soft-tissue]
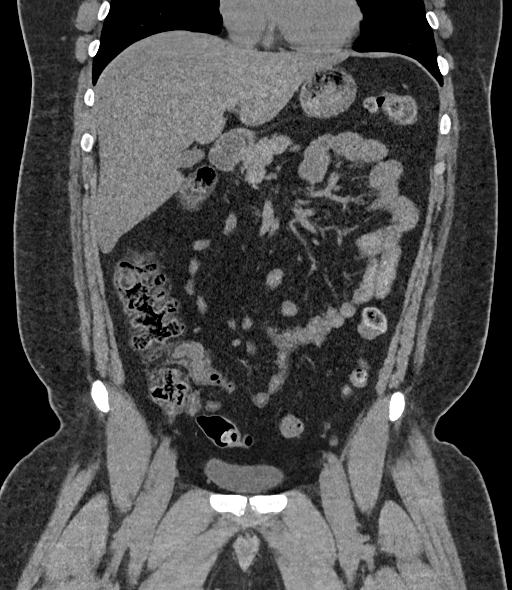
[im 129/233  soft-tissue]
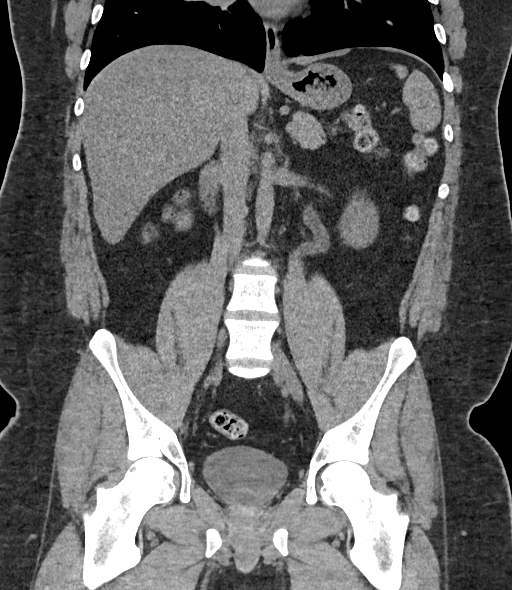

[17 of 46 positions shown; findings below may reference images not displayed]

FINDINGS: Lower chest: No acute findings.

Hepatobiliary: No mass visualized on this unenhanced exam.
Gallbladder is unremarkable. No evidence of biliary ductal
dilatation.

Pancreas: No mass or inflammatory process visualized on this
unenhanced exam.

Spleen:  Within normal limits in size.

Adrenals/Urinary tract: Moderate left hydroureteronephrosis is seen
due to a 3 mm calculus in the mid pelvic portion of the left ureter.
This is unchanged since prior study. A 2 mm calculus is again seen
in lower pole of left kidney.

Stomach/Bowel: No evidence of obstruction, inflammatory process, or
abnormal fluid collections.

Vascular/Lymphatic: No pathologically enlarged lymph nodes
identified. No evidence of abdominal aortic aneurysm.

Reproductive:  No mass or other significant abnormality.

Other:  None.

Musculoskeletal:  No suspicious bone lesions identified.
IMPRESSION: Moderate left hydroureteronephrosis due to 3 mm calculus in mid
pelvic portion of left ureter, unchanged since prior exam.

2 mm left renal calculus.

## 2021-11-23 ENCOUNTER — Other Ambulatory Visit: Payer: Self-pay | Admitting: *Deleted

## 2021-11-23 DIAGNOSIS — N201 Calculus of ureter: Secondary | ICD-10-CM

## 2021-11-23 NOTE — Progress Notes (Signed)
11/25/21 ?8:49 AM  ? ?Jeffery Everett ?1996-03-14 ?[id number] ? ?Referring provider:  ?No referring provider defined for this encounter. ? ?Chief Complaint  ?Patient presents with  ? Follow-up  ? ? ?Urological history  ?1.  Nephrolithiasis ?-Ureteroscopy in February 2021 ?- S/p ureteroscopy with Dr Apolinar JunesBrandon on 02/25/2021 for 3 mm left distal ureteral calculus  ?- S/p ureteroscopy for left ureteral stricture and calculus on 03/10/2021 ? ?HPI: ?Jeffery Everett is a 26 y.o.male who presents today for a follow-up with scans.  ? ?RUS today was personally reviewed and visualized persistent moderate left hydronephrosis with no left ureteral jet.  KUB was also personally reviewed and interpreted and visualized no stone burden.  ? ?He reports that on Tuesday night, he experienced pain that was similar to previous stone pain.  Patient denies any modifying or aggravating factors.  Patient denies any gross hematuria, dysuria .  Patient denies any fevers, chills, nausea or vomiting.  ? ?He is not currently having pain at this time.  ? ? ?PMH: ?Past Medical History:  ?Diagnosis Date  ? ADHD (attention deficit hyperactivity disorder)   ? History of kidney stones   ? ? ?Surgical History: ?Past Surgical History:  ?Procedure Laterality Date  ? CYSTOSCOPY/URETEROSCOPY/HOLMIUM LASER/STENT PLACEMENT Left 09/04/2019  ? Procedure: CYSTOSCOPY/URETEROSCOPY/HOLMIUM LASER/STENT PLACEMENT;  Surgeon: Vanna ScotlandBrandon, Ashley, MD;  Location: [facility];  Service: Urology;  Laterality: Left;  ? CYSTOSCOPY/URETEROSCOPY/HOLMIUM LASER/STENT PLACEMENT Left 02/25/2021  ? Procedure: CYSTOSCOPY/URETEROSCOPY/HOLMIUM LASER/STENT PLACEMENT;  Surgeon: Vanna ScotlandBrandon, Ashley, MD;  Location: [facility];  Service: Urology;  Laterality: Left;  Holium  ? CYSTOSCOPY/URETEROSCOPY/HOLMIUM LASER/STENT PLACEMENT Left 03/10/2021  ? Procedure: CYSTOSCOPY/URETEROSCOPY/HOLMIUM LASER/STENT EXCHANGE;  Surgeon: Vanna ScotlandBrandon, Ashley, MD;  Location: [facility];  Service: Urology;  Laterality: Left;  ?  EXTRACORPOREAL SHOCK WAVE LITHOTRIPSY Left 01/08/2020  ? Procedure: EXTRACORPOREAL SHOCK WAVE LITHOTRIPSY (ESWL);  Surgeon: Sondra ComeSninsky, Brian C, MD;  Location: [facility];  Service: Urology;  Laterality: Left;  ? ? ?Home Medications:  ?Allergies as of 11/24/2021   ?No Known Allergies ?  ? ?  ?Medication List  ?  ? ?  ? Accurate as of Nov 24, 2021 11:59 PM. If you have any questions, ask your nurse or doctor.  ?  ?  ? ?  ? ?diphenhydrAMINE 25 MG tablet ?Commonly known as: BENADRYL ?Take 25 mg by mouth every 6 (six) hours as needed for allergies. ?  ?ibuprofen 800 MG tablet ?Commonly known as: ADVIL ?Take 1 tablet (800 mg total) by mouth every 8 (eight) hours as needed for mild pain. ?  ?tamsulosin 0.4 MG Caps capsule ?Commonly known as: FLOMAX ?Take 1 capsule (0.4 mg total) by mouth daily. ?Started by: Michiel CowboySHANNON Jaselyn Nahm, PA-C ?  ? ?  ? ? ?Allergies:  ?No Known Allergies ? ?Family History: ?Family History  ?Problem Relation Age of Onset  ? Hypertension Mother   ? ADD / ADHD Father   ? Hypertension Paternal Grandmother   ? Bipolar disorder Paternal Grandmother   ? Fibromyalgia Paternal Grandmother   ? COPD Paternal Grandmother   ? Heart disease Paternal Grandfather   ? ? ?Social History:  reports that he has never smoked. He has never used smokeless tobacco. He reports current alcohol use of about 1.0 standard drink per week. He reports current drug use. Drug: Morphine. ? ? ?Physical Exam: ?BP 126/78   Pulse 91   Ht 6\' 2"  (1.88 m)   Wt (!) 342 lb (155.1 kg)   BMI 43.91 kg/m?   ?Constitutional:  Alert and oriented, No acute distress. ?  HEENT: [place] AT, moist mucus membranes.  Trachea midline, no masses. ?Cardiovascular: No clubbing, cyanosis, or edema. ?Respiratory: Normal respiratory effort, no increased work of breathing. ?Skin: No rashes, bruises or suspicious lesions. ?Neurologic: Grossly intact, no focal deficits, moving all 4 extremities. ?Psychiatric: Normal mood and affect. ? ?Laboratory Data: ?BMP  pending ? ?Urinalysis ?Component ?    Latest Ref Rng 11/24/2021  ?Specific Gravity, UA ?    1.005 - 1.030  1.025   ?pH, UA ?    5.0 - 7.5  6.0   ?Color, UA ?    Yellow  Yellow   ?Appearance Ur ?    Clear  Clear   ?Leukocytes,UA ?    Negative  Negative   ?Protein,UA ?    Negative/Trace  Negative   ?Glucose, UA ?    Negative  Negative   ?Ketones, UA ?    Negative  Negative   ?RBC, UA ?    Negative  Negative   ?Bilirubin, UA ?    Negative  Negative   ?Urobilinogen, Ur ?    0.2 - 1.0 mg/dL 0.2   ?Nitrite, UA ?    Negative  Negative   ?Microscopic Examination See below:   ? ?Component ?    Latest Ref Rng 11/24/2021  ?RBC ?    0 - 2 /hpf 0-2   ?WBC, UA ?    0 - 5 /hpf 0-5   ?Epithelial Cells (non renal) ?    0 - 10 /hpf None seen   ?Bacteria, UA ?    None seen/Few  None seen   ?I have reviewed the labs.  ? ? ?Pertinent Imaging: ?CLINICAL DATA:  History of renal stones. ?  ?EXAM: ?RENAL / URINARY TRACT ULTRASOUND COMPLETE ?  ?COMPARISON:  Renal ultrasound 05/20/2021 ?  ?FINDINGS: ?Right Kidney: ?  ?Renal measurements: 11.8 x 4.9 x 6.1 cm = volume: 184 mL. ?Echogenicity within normal limits. No mass or hydronephrosis ?visualized. ?  ?Left Kidney: ?  ?Renal measurements: 14.3 x 6.3 x 6.3 cm = volume: 300 mL. Persistent ?moderate hydronephrosis. Normal renal cortical thickness and ?echogenicity. ?  ?Bladder: ?  ?Unremarkable.  No urine jet from left side. ?  ?Other: ?  ?None ?  ?IMPRESSION: ?Persistent moderate left hydronephrosis. ?  ?  ?Electronically Signed ?  By: Annia Belt M.D. ?  On: 11/24/2021 12:39 ?  ?CLINICAL DATA:  Left-sided flank pain ?  ?EXAM: ?ABDOMEN - 1 VIEW ?  ?COMPARISON:  02/15/2021 ?  ?FINDINGS: ?Scattered large and small bowel gas is noted. No obstructive changes ?are seen. No abnormal mass or abnormal calcifications are noted. No ?bony abnormality is seen. ?  ?IMPRESSION: ?No acute abnormality noted. ?  ?  ?Electronically Signed ?  By: Alcide Clever M.D. ?  On: 11/25/2021 03:48 ? I have independently  reviewed the films.  See HPI.   ? ?Assessment & Plan:   ? ?Nephrolithiasis / flank pain ?- RUS visualized hydronephrosis with no left ureteral jet, but the KUB showed no stone burden  ?- UA showed no evidence of micro heme  ?- In light of symptoms will treat as stone clinically with Flomax with CT is pending  ?- Will send urine for culture to rule out any underlying infection  ? ?2. Left hydronephrosis  ?- Will monitor kidney function with BMP  ? ? ?Return for CT scan and appointment with Dr. Apolinar Junes . ? ?[facility] ?[address], [address] ?[place], [place] [address] ?[phone] ? ?  I,Jeremian Whitby,acting as a Neurosurgeon for [employer], PA-C.,have documented all relevant documentation on the behalf of Jeven Topper, PA-C,as directed by  [facility], PA-C while in the presence of Damani Kelemen, PA-C. ? ?I have reviewed the above documentation for accuracy and completeness, and I agree with the above.   ? ?Michiel Cowboy, PA-C  ?

## 2021-11-24 ENCOUNTER — Ambulatory Visit (INDEPENDENT_AMBULATORY_CARE_PROVIDER_SITE_OTHER): Payer: BC Managed Care – PPO | Admitting: Urology

## 2021-11-24 ENCOUNTER — Ambulatory Visit
Admission: RE | Admit: 2021-11-24 | Discharge: 2021-11-24 | Disposition: A | Discharge status: Home / Self Care | Payer: BC Managed Care – PPO | Source: Ambulatory Visit | Attending: Urology | Admitting: Urology

## 2021-11-24 ENCOUNTER — Ambulatory Visit
Admission: RE | Admit: 2021-11-24 | Discharge: 2021-11-24 | Disposition: A | Discharge status: Home / Self Care | Payer: BC Managed Care – PPO | Source: Home / Self Care | Attending: Urology | Admitting: Urology

## 2021-11-24 ENCOUNTER — Encounter: Payer: Self-pay | Admitting: Urology

## 2021-11-24 VITALS — BP 126/78 | HR 91 | Ht 74.0 in | Wt 342.0 lb

## 2021-11-24 DIAGNOSIS — Z87442 Personal history of urinary calculi: Secondary | ICD-10-CM | POA: Diagnosis not present

## 2021-11-24 DIAGNOSIS — N201 Calculus of ureter: Secondary | ICD-10-CM | POA: Insufficient documentation

## 2021-11-24 DIAGNOSIS — R109 Unspecified abdominal pain: Secondary | ICD-10-CM | POA: Diagnosis not present

## 2021-11-24 DIAGNOSIS — N200 Calculus of kidney: Secondary | ICD-10-CM

## 2021-11-24 DIAGNOSIS — N131 Hydronephrosis with ureteral stricture, not elsewhere classified: Secondary | ICD-10-CM

## 2021-11-24 LAB — URINALYSIS, COMPLETE
Bilirubin, UA: NEGATIVE
Glucose, UA: NEGATIVE
Ketones, UA: NEGATIVE
Leukocytes,UA: NEGATIVE
Nitrite, UA: NEGATIVE
Protein,UA: NEGATIVE
RBC, UA: NEGATIVE
Specific Gravity, UA: 1.025 (ref 1.005–1.030)
Urobilinogen, Ur: 0.2 mg/dL (ref 0.2–1.0)
pH, UA: 6 (ref 5.0–7.5)

## 2021-11-24 LAB — MICROSCOPIC EXAMINATION
Bacteria, UA: NONE SEEN
Epithelial Cells (non renal): NONE SEEN /hpf (ref 0–10)

## 2021-11-24 MED ORDER — TAMSULOSIN HCL 0.4 MG PO CAPS: 0.4000 mg | ORAL_CAPSULE | Freq: Every day | ORAL | 3 refills | Status: AC

## 2021-11-25 ENCOUNTER — Telehealth: Payer: Self-pay

## 2021-11-25 LAB — BASIC METABOLIC PANEL
BUN/Creatinine Ratio: 11 (ref 9–20)
BUN: 12 mg/dL (ref 6–20)
CO2: 23 mmol/L (ref 20–29)
Calcium: 9.5 mg/dL (ref 8.7–10.2)
Chloride: 102 mmol/L (ref 96–106)
Creatinine, Ser: 1.13 mg/dL (ref 0.76–1.27)
Glucose: 102 mg/dL — ABNORMAL HIGH (ref 70–99)
Potassium: 4.2 mmol/L (ref 3.5–5.2)
Sodium: 140 mmol/L (ref 134–144)
eGFR: 94 mL/min/{1.73_m2} (ref >59)

## 2021-11-25 NOTE — Telephone Encounter (Signed)
-----   Message from Harle Battiest, PA-C sent at 11/25/2021  8:19 AM EDT ----- ?Please let Jeffery Everett know that his renal function is fine at this time.  We will proceed with the CT as planned and follow up with Dr. Apolinar Junes.  ?

## 2021-11-25 NOTE — Telephone Encounter (Signed)
Notified pt as advised, pt expressed understanding.  ?

## 2021-11-27 LAB — CULTURE, URINE COMPREHENSIVE

## 2021-11-28 ENCOUNTER — Other Ambulatory Visit: Payer: Self-pay | Admitting: Urology

## 2021-11-28 DIAGNOSIS — N200 Calculus of kidney: Secondary | ICD-10-CM

## 2021-12-27 ENCOUNTER — Ambulatory Visit
Admission: RE | Admit: 2021-12-27 | Discharge: 2021-12-27 | Disposition: A | Discharge status: Home / Self Care | Payer: BC Managed Care – PPO | Attending: Urology | Admitting: Urology

## 2021-12-27 ENCOUNTER — Ambulatory Visit: Payer: BC Managed Care – PPO | Admitting: Urology

## 2021-12-27 ENCOUNTER — Ambulatory Visit
Admission: RE | Admit: 2021-12-27 | Discharge: 2021-12-27 | Disposition: A | Discharge status: Home / Self Care | Payer: BC Managed Care – PPO | Source: Ambulatory Visit | Attending: Urology | Admitting: Urology

## 2021-12-27 DIAGNOSIS — N200 Calculus of kidney: Secondary | ICD-10-CM

## 2021-12-27 NOTE — Progress Notes (Incomplete)
12/27/21 6:08 AM   Jeffery Everett [birth year] RN:[id number]  Referring provider:  No referring provider defined for this encounter. No chief complaint on file.     HPI: Jeffery Everett is a 26 y.o.male  with a personal history of nephrolithiasis, who presents today for a follow-up.   He is s/p ureteroscopy on 02/25/2021. On 03/10/2021 he underwent a cystoscopy/ ureteroscopy/holmium laser/ stent exchange for left ureteral stricture and left ureteral calculus. Stent was removed on 04/07/2021.  Notably, the stone was heavily impacted.  Is a difficult ureteroscopy's in the past as well.  Lasix renal study revealed mild partial obstructive hydronephrosis of the LEFT kidney. Mild postvoid residual.  He underwent an RUS on 11/24/2021 that visualized persistent moderate left hydronephrosis. He also underwent a KUB that showed no acute abnormality.   PMH: Past Medical History:  Diagnosis Date   ADHD (attention deficit hyperactivity disorder)    History of kidney stones     Surgical History: Past Surgical History:  Procedure Laterality Date   CYSTOSCOPY/URETEROSCOPY/HOLMIUM LASER/STENT PLACEMENT Left 09/04/2019   Procedure: CYSTOSCOPY/URETEROSCOPY/HOLMIUM LASER/STENT PLACEMENT;  Surgeon: Hollice Espy, MD;  Location: [facility];  Service: Urology;  Laterality: Left;   CYSTOSCOPY/URETEROSCOPY/HOLMIUM LASER/STENT PLACEMENT Left 02/25/2021   Procedure: CYSTOSCOPY/URETEROSCOPY/HOLMIUM LASER/STENT PLACEMENT;  Surgeon: Hollice Espy, MD;  Location: [facility];  Service: Urology;  Laterality: Left;  Holium   CYSTOSCOPY/URETEROSCOPY/HOLMIUM LASER/STENT PLACEMENT Left 03/10/2021   Procedure: CYSTOSCOPY/URETEROSCOPY/HOLMIUM LASER/STENT EXCHANGE;  Surgeon: Hollice Espy, MD;  Location: [facility];  Service: Urology;  Laterality: Left;   EXTRACORPOREAL SHOCK WAVE LITHOTRIPSY Left 01/08/2020   Procedure: EXTRACORPOREAL SHOCK WAVE LITHOTRIPSY (ESWL);  Surgeon: Billey Co, MD;  Location: [facility];  Service: Urology;  Laterality: Left;    Home Medications:  Allergies as of 12/27/2021   No Known Allergies      Medication List        Accurate as of December 27, 2021  6:08 AM. If you have any questions, ask your nurse or doctor.          diphenhydrAMINE 25 MG tablet Commonly known as: BENADRYL Take 25 mg by mouth every 6 (six) hours as needed for allergies.   ibuprofen 800 MG tablet Commonly known as: ADVIL Take 1 tablet (800 mg total) by mouth every 8 (eight) hours as needed for mild pain.   tamsulosin 0.4 MG Caps capsule Commonly known as: FLOMAX Take 1 capsule (0.4 mg total) by mouth daily.        Allergies:  No Known Allergies  Family History: Family History  Problem Relation Age of Onset   Hypertension Mother    ADD / ADHD Father    Hypertension Paternal Grandmother    Bipolar disorder Paternal Grandmother    Fibromyalgia Paternal Grandmother    COPD Paternal Grandmother    Heart disease Paternal Grandfather     Social History:  reports that he has never smoked. He has never used smokeless tobacco. He reports current alcohol use of about 1.0 standard drink per week. He reports current drug use. Drug: Morphine.   Physical Exam: There were no vitals taken for this visit.  Constitutional:  Alert and oriented, No acute distress. HEENT: [place] AT, moist mucus membranes.  Trachea midline, no masses. Cardiovascular: No clubbing, cyanosis, or edema. Respiratory: Normal respiratory effort, no increased work of breathing. Skin: No rashes, bruises or suspicious lesions. Neurologic: Grossly intact, no focal deficits, moving all 4 extremities. Psychiatric: Normal mood and affect.  Laboratory Data:  Lab Results  Component Value Date  CREATININE 1.13 11/24/2021   Lab Results  Component Value Date   HGBA1C 5.5 07/30/2019    Urinalysis   Pertinent Imaging:    Assessment & Plan:     No follow-ups on file.  I,Kailey Littlejohn,acting as a Education administrator  for Hollice Espy, MD.,have documented all relevant documentation on the behalf of Hollice Espy, MD,as directed by  Hollice Espy, MD while in the presence of Hollice Espy, [place] [address], [place], [place] [address] [phone]

## 2022-01-04 ENCOUNTER — Ambulatory Visit: Payer: BC Managed Care – PPO | Admitting: Urology

## 2022-01-06 ENCOUNTER — Ambulatory Visit
Admission: RE | Admit: 2022-01-06 | Discharge: 2022-01-06 | Disposition: A | Discharge status: Home / Self Care | Payer: BC Managed Care – PPO | Source: Ambulatory Visit | Attending: Urology | Admitting: Urology

## 2022-01-06 DIAGNOSIS — N200 Calculus of kidney: Secondary | ICD-10-CM

## 2022-01-11 ENCOUNTER — Ambulatory Visit (INDEPENDENT_AMBULATORY_CARE_PROVIDER_SITE_OTHER): Payer: BC Managed Care – PPO | Admitting: Urology

## 2022-01-11 ENCOUNTER — Encounter: Payer: Self-pay | Admitting: Urology

## 2022-01-11 VITALS — BP 135/84 | HR 101 | Ht 74.0 in | Wt 349.0 lb

## 2022-01-11 DIAGNOSIS — N1330 Unspecified hydronephrosis: Secondary | ICD-10-CM

## 2022-01-11 DIAGNOSIS — N201 Calculus of ureter: Secondary | ICD-10-CM | POA: Diagnosis not present

## 2022-01-11 NOTE — Progress Notes (Signed)
01/11/2022 4:55 PM   Jeffery Everett 08-31-1995 [id number]  Referring provider:  No referring provider defined for this encounter. Chief Complaint  Patient presents with   Nephrolithiasis    Ctscan results      HPI: Jeffery Everett is a 26 y.o.male with a personal history of nephrolithiasis s/p ureteroscopy on 8/05/20222 and 03/11/2021, who presents today for a follow-up with CT scan results.   He was see in clinic by Jeffery Cowboy, PA-C on 11/25/2021. He was noted to have experienced left flank pain that was similar to a stone stone previously.   He underwent a CT renal stone study on 01/06/2022 to further evaluate his left flank pain. It visualized 2-3 mm calculus in mid pelvic portion of left ureter causing moderate left hydroureteronephrosis.   Multiple CT scans were reviewed dating back to 2021.  On each occasion, calcification could be seen at the site of previously heavily impacted stone just below the iliac vessel.  He reports that he had an episode of stone pain last month and he followed up with PA here that pain has since resolved.  The pain only lasted 1 day.  Has not recurred.  Notably, he did also have a recent Lasix renal study after noted persistent hydronephrosis following multiple ureteroscopy's which were difficult.  This showed split function of 44.5% in the left kidney with equivocal drainage on the left, 20 minutes on the left compared to 10 minutes on the right interpreted as mild partial obstruction.  PMH: Past Medical History:  Diagnosis Date   ADHD (attention deficit hyperactivity disorder)    History of kidney stones     Surgical History: Past Surgical History:  Procedure Laterality Date   CYSTOSCOPY/URETEROSCOPY/HOLMIUM LASER/STENT PLACEMENT Left 09/04/2019   Procedure: CYSTOSCOPY/URETEROSCOPY/HOLMIUM LASER/STENT PLACEMENT;  Surgeon: [name], MD;  Location: [facility];  Service: Urology;  Laterality: Left;    CYSTOSCOPY/URETEROSCOPY/HOLMIUM LASER/STENT PLACEMENT Left 02/25/2021   Procedure: CYSTOSCOPY/URETEROSCOPY/HOLMIUM LASER/STENT PLACEMENT;  Surgeon: [name], MD;  Location: [facility];  Service: Urology;  Laterality: Left;  [place]   CYSTOSCOPY/URETEROSCOPY/HOLMIUM LASER/STENT PLACEMENT Left 03/10/2021   Procedure: CYSTOSCOPY/URETEROSCOPY/HOLMIUM LASER/STENT EXCHANGE;  Surgeon: [name], MD;  Location: [facility];  Service: Urology;  Laterality: Left;   EXTRACORPOREAL SHOCK WAVE LITHOTRIPSY Left 01/08/2020   Procedure: EXTRACORPOREAL SHOCK WAVE LITHOTRIPSY (ESWL);  Surgeon: Sondra Come, MD;  Location: [facility];  Service: Urology;  Laterality: Left;    Home Medications:  Allergies as of 01/11/2022   No Known Allergies      Medication List        Accurate as of January 11, 2022  4:55 PM. If you have any questions, ask your nurse or doctor.          diphenhydrAMINE 25 MG tablet Commonly known as: BENADRYL Take 25 mg by mouth every 6 (six) hours as needed for allergies.   ibuprofen 800 MG tablet Commonly known as: ADVIL Take 1 tablet (800 mg total) by mouth every 8 (eight) hours as needed for mild pain.   tamsulosin 0.4 MG Caps capsule Commonly known as: FLOMAX Take 1 capsule (0.4 mg total) by mouth daily.        Allergies:  No Known Allergies  Family History: Family History  Problem Relation Age of Onset   Hypertension Mother    ADD / ADHD Father    Hypertension Paternal Grandmother    Bipolar disorder Paternal Grandmother    Fibromyalgia Paternal Grandmother    COPD Paternal Grandmother    Heart disease Paternal  Grandfather     Social History:  reports that he has never smoked. He has never used smokeless tobacco. He reports current alcohol use of about 1.0 standard drink of alcohol per week. He reports current drug use. Drug: Morphine.   Physical Exam: BP 135/84   Pulse (!) 101   Ht 6\' 2"  (1.88 m)   Wt (!) 349 lb (158.3 kg)   BMI 44.81 kg/m    Constitutional:  Alert and oriented, No acute distress. HEENT:  AT, moist mucus membranes.  Trachea midline, no masses. Cardiovascular: No clubbing, cyanosis, or edema. Respiratory: Normal respiratory effort, no increased work of breathing. Skin: No rashes, bruises or suspicious lesions. Neurologic: Grossly intact, no focal deficits, moving all 4 extremities. Psychiatric: Normal mood and affect.  Laboratory Data:  Lab Results  Component Value Date   CREATININE 1.13 11/24/2021   Lab Results  Component Value Date   HGBA1C 5.5 07/30/2019    Pertinent Imaging: CLINICAL DATA:  Left flank pain.  Nephrolithiasis.   EXAM: CT ABDOMEN AND PELVIS WITHOUT CONTRAST   TECHNIQUE: Multidetector CT imaging of the abdomen and pelvis was performed following the standard protocol without IV contrast.   RADIATION DOSE REDUCTION: This exam was performed according to the departmental dose-optimization program which includes automated exposure control, adjustment of the mA and/or kV according to patient size and/or use of iterative reconstruction technique.   COMPARISON:  02/18/2021   FINDINGS: Lower chest: No acute findings.   Hepatobiliary: No mass visualized on this unenhanced exam. Moderate diffuse hepatic steatosis is noted. Gallbladder is unremarkable. No evidence of biliary ductal dilatation.   Pancreas: No mass or inflammatory process visualized on this unenhanced exam.   Spleen:  Within normal limits in size.   Adrenals/Urinary tract: Moderate left hydroureteronephrosis is stable since previous study. A 2-3 mm calculus is seen in the mid pelvic portion of the left ureter adjacent to the iliac vessels, without change since prior study. No other ureteral calculi identified. Unremarkable unopacified urinary bladder.   Stomach/Bowel: No evidence of obstruction, inflammatory process, or abnormal fluid collections. Normal appendix visualized.   Vascular/Lymphatic: No  pathologically enlarged lymph nodes identified. No evidence of abdominal aortic aneurysm.   Reproductive:  No mass or other significant abnormality.   Other:  None.   Musculoskeletal:  No suspicious bone lesions identified.   IMPRESSION: 2-3 mm calculus in mid pelvic portion of left ureter causing moderate left hydroureteronephrosis, without change since prior exam.   Hepatic steatosis.     Electronically Signed   By: 02/20/2021 M.D.   On: 01/07/2022 17:04   I have personally reviewed the images and agree with radiologist interpretation.  I also compared this to all previous CT scans in our system.   Assessment & Plan:    Nephrolithiasis / flank pain/left ureteral stricture - CT stone showed calcification in mid pelvic portion of left ureter causing moderate left hydroureteronephrosis however I am highly suspicious that this is a stenotic area in his ureter possibly with either small stone versus intramural calcification in the wall of the ureter based on his previous heavy impaction/stricture --He has tried and failed prolonged postoperative stent with larger stent size, continues to have issues at this exact location -At this point, I have very strongly recommend consideration of more definitive management.   He is relatively young, still has preserved function in this particular kidney and would likely benefit from definitive management of this given his recurrent issues with the same exact location in his  ureter.   -In the past, he has declined both this as well as chronic stent.  He is more open to this today.  He may benefit from ureteral reimplant versus primary reconstruction. will sent him for a second opinion at [facility].  - Referral sent to [facility]  2. Left hydronephrosis  - As above     Tawni Millers as a scribe for [name], MD.,have documented all relevant documentation on the behalf of [name], MD,as directed by  [name], MD while in the  presence of [name], MD.  I have reviewed the above documentation for accuracy and completeness, and I agree with the above.   [name], MD    [facility] [address] [place], [place] [address] [phone]

## 2022-03-18 ENCOUNTER — Other Ambulatory Visit: Payer: Self-pay

## 2022-03-18 ENCOUNTER — Encounter: Payer: Self-pay | Admitting: Emergency Medicine

## 2022-03-18 ENCOUNTER — Emergency Department: Payer: BC Managed Care – PPO

## 2022-03-18 ENCOUNTER — Emergency Department
Admission: EM | Admit: 2022-03-18 | Discharge: 2022-03-18 | Disposition: A | Discharge status: Home / Self Care | Payer: BC Managed Care – PPO | Attending: Emergency Medicine | Admitting: Emergency Medicine

## 2022-03-18 DIAGNOSIS — R0602 Shortness of breath: Secondary | ICD-10-CM | POA: Diagnosis present

## 2022-03-18 DIAGNOSIS — J189 Pneumonia, unspecified organism: Secondary | ICD-10-CM | POA: Insufficient documentation

## 2022-03-18 DIAGNOSIS — Z20822 Contact with and (suspected) exposure to covid-19: Secondary | ICD-10-CM | POA: Diagnosis not present

## 2022-03-18 DIAGNOSIS — R7989 Other specified abnormal findings of blood chemistry: Secondary | ICD-10-CM | POA: Insufficient documentation

## 2022-03-18 LAB — COMPREHENSIVE METABOLIC PANEL
ALT: 214 U/L — ABNORMAL HIGH (ref 0–44)
AST: 90 U/L — ABNORMAL HIGH (ref 15–41)
Albumin: 4.2 g/dL (ref 3.5–5.0)
Alkaline Phosphatase: 81 U/L (ref 38–126)
Anion gap: 9 (ref 5–15)
BUN: 10 mg/dL (ref 6–20)
CO2: 23 mmol/L (ref 22–32)
Calcium: 9.2 mg/dL (ref 8.9–10.3)
Chloride: 105 mmol/L (ref 98–111)
Creatinine, Ser: 0.97 mg/dL (ref 0.61–1.24)
GFR, Estimated: >60 mL/min (ref >60)
Glucose, Bld: 115 mg/dL — ABNORMAL HIGH (ref 70–99)
Potassium: 3.8 mmol/L (ref 3.5–5.1)
Sodium: 137 mmol/L (ref 135–145)
Total Bilirubin: 1.2 mg/dL (ref 0.3–1.2)
Total Protein: 8.1 g/dL (ref 6.5–8.1)

## 2022-03-18 LAB — CBC
HCT: 42.3 % (ref 39.0–52.0)
Hemoglobin: 14 g/dL (ref 13.0–17.0)
MCH: 29.5 pg (ref 26.0–34.0)
MCHC: 33.1 g/dL (ref 30.0–36.0)
MCV: 89.1 fL (ref 80.0–100.0)
Platelets: 248 10*3/uL (ref 150–400)
RBC: 4.75 MIL/uL (ref 4.22–5.81)
RDW: 11.7 % (ref 11.5–15.5)
WBC: 6.4 10*3/uL (ref 4.0–10.5)
nRBC: 0 % (ref 0.0–0.2)

## 2022-03-18 LAB — RESP PANEL BY RT-PCR (FLU A&B, COVID) ARPGX2
Influenza A by PCR: NEGATIVE
Influenza B by PCR: NEGATIVE
SARS Coronavirus 2 by RT PCR: NEGATIVE

## 2022-03-18 MED ORDER — AMOXICILLIN-POT CLAVULANATE 875-125 MG PO TABS: 1.0000 | ORAL_TABLET | Freq: Two times a day (BID) | ORAL | 0 refills | Status: AC

## 2022-03-18 MED ORDER — BENZONATATE 100 MG PO CAPS: 100.0000 mg | ORAL_CAPSULE | Freq: Three times a day (TID) | ORAL | 0 refills | Status: AC | PRN

## 2022-03-18 MED ORDER — AZITHROMYCIN 250 MG PO TABS: ORAL_TABLET | ORAL | 0 refills | Status: AC

## 2022-03-18 MED ORDER — IBUPROFEN 600 MG PO TABS
600.0000 mg | ORAL_TABLET | Freq: Once | ORAL | Status: AC
  Administered 2022-03-18: 600 mg via ORAL
  Filled 2022-03-18: qty 1

## 2022-03-18 NOTE — ED Notes (Signed)
Administered pt oxygen 2L/[place]

## 2022-03-18 NOTE — ED Triage Notes (Signed)
Pt reports shortness of breath coughing for the past week, productive cough green and yellow in color

## 2022-03-18 NOTE — ED Notes (Signed)
Patient ambulated approx. 100 ft with upright steady gait. O2 pulse ox was monitored and sats remained 92 - 93%. Denied any worsening SOB, no S/S of distress observed. MD notified.

## 2022-03-18 NOTE — ED Provider Notes (Signed)
[facility] Provider Note    Event Date/Time   First MD Initiated Contact with Patient 03/18/22 0715     (approximate)   History   Shortness of Breath   HPI  Jeffery Everett is a 26 y.o. male who comes in for shortness of breath coughing for the past week with productive cough green and yellow in color.  Patient denies any IV drug use.  He denies any daily alcohol use.  He reports being seen in outside hospital a week ago with weakness and sweats had a COVID test that was negative but never had a chest x-ray.  He reports that since that he had increasing coughing productive mucus.  Denies any history of blood clots, swelling in 1 leg.  He denies any urinary symptoms.  On review of records he has a history of having nephrolithiasis.  Physical Exam   Triage Vital Signs: ED Triage Vitals  Enc Vitals Group     BP 03/18/22 0130 125/84     Pulse Rate 03/18/22 0130 (!) 110     Resp 03/18/22 0130 (!) 22     Temp 03/18/22 0130 100 F (37.8 C)     Temp Source 03/18/22 0130 Oral     SpO2 03/18/22 0130 (!) 89 %     Weight 03/18/22 0128 (!) 350 lb (158.8 kg)     Height 03/18/22 0128 6\' 3"  (1.905 m)     Head Circumference --      Peak Flow --      Pain Score 03/18/22 0126 0     Pain Loc --      Pain Edu? --      Excl. in GC? --     Most recent vital signs: Vitals:   03/18/22 0130  BP: 125/84  Pulse: (!) 110  Resp: (!) 22  Temp: 100 F (37.8 C)  SpO2: (!) 89%     General: Awake, no distress.  CV:  Good peripheral perfusion.  Resp:  Normal effort.  Occasional coughing. Abd:  No distention.  Soft and nontender Other:  No swelling the leg.  No calf tenderness   ED Results / Procedures / Treatments   Labs (all labs ordered are listed, but only abnormal results are displayed) Labs Reviewed  COMPREHENSIVE METABOLIC PANEL - Abnormal; Notable for the following components:      Result Value   Glucose, Bld 115 (*)    AST 90 (*)    ALT 214 (*)     All other components within normal limits  RESP PANEL BY RT-PCR (FLU A&B, COVID) ARPGX2  CBC     EKG  My interpretation of EKG:  Sinus tachycardia rate of 113 no ST elevation or T wave inversions, normal intervals  RADIOLOGY  I reviewed the x-ray personally and interpreted and patient does have a right middle lobe pneumonia  PROCEDURES:  Critical Care performed: No  Procedures   MEDICATIONS ORDERED IN ED: Medications  ibuprofen (ADVIL) tablet 600 mg (has no administration in time range)     IMPRESSION / MDM / ASSESSMENT AND PLAN / ED COURSE  I reviewed the triage vital signs and the nursing notes.   Patient's presentation is most consistent with acute presentation with potential threat to life or bodily function.   Differential is COVID, flu, pneumonia considered PE but he has no risk factors other than being slightly tachycardic but I suspect this is more likely from dehydration and illness.  He denies any recent long  travel, recent surgery, hormone use, swelling in his legs, calf tenderness , vaping history that would put him at higher risk for PEs.  Patient is well-appearing does not appear bacteremic and denies any IV drug use to suggest endocarditis.  COVID and flu test are negative CBC reassuring CMP shows slightly elevated LFTs which he has had previously and a little bit higher than normal.  On examination his abdomen is soft and nontender.  We discussed following up with this with his primary care doctor.  He denies any excessive Tylenol use and we discussed using no more than 2 g in a day.  Chest x-ray confirms pneumonia given patient's symptoms that seems consistent with pneumonia at this time I have low suspicion for pulmonary embolism due to the above.  We discussed IV fluids given patient's heart rate was elevated but patient does not want to stay any longer in the emergency room.  He has been here over 7 hours waiting to be seen.  We discussed admission  versus discharge.  Patient's oxygen levels have been 92 to 95%.  There is a question of a low saturation of 89% while out in triage but when the nurse went to get him from the waiting room he was not even wearing his oxygen and it was 94%.  We did an ambulatory saturation with saturations above 92%.  We discussed that he could require oxygen in the next 24 to 48 hours and if he develops any worsening shortness of breath he needs to return to the ER immediately.  Patient does not want to stay in the hospital.  I offered him IV fluids and IV antibiotics to help with his illness but he is requesting to be discharged at this time.  His fiance is there to witness this conversation.  Patient's heart rate was ranging between 98 and 102 when I was discussing with him IV fluids. He is tolerating PO at bedside and prefers PO hydration at home.    The patient is on the cardiac monitor to evaluate for evidence of arrhythmia and/or significant heart rate changes.      FINAL CLINICAL IMPRESSION(S) / ED DIAGNOSES   Final diagnoses:  Community acquired pneumonia of right middle lobe of lung     Rx / DC Orders   ED Discharge Orders          Ordered    amoxicillin-clavulanate (AUGMENTIN) 875-125 MG tablet  2 times daily        03/18/22 0848    azithromycin (ZITHROMAX Z-PAK) 250 MG tablet        03/18/22 0848    benzonatate (TESSALON PERLES) 100 MG capsule  3 times daily PRN        03/18/22 0848             Note:  This document was prepared using Dragon voice recognition software and may include unintentional dictation errors.   Concha Se, MD 03/18/22 [phone]

## 2022-03-18 NOTE — Discharge Instructions (Addendum)
We discussed admission for your pneumonia given your low oxygen levels but given they were above 90% we are going to allow you to go home with oral antibiotics.  Your heart rate was elevated and we recommended IV fluids but you have elected to want to go home prior to getting these.  If you develop increasing weakness or shortness of breath please return to the ER immediately for reevaluation.  Please follow-up with your primary care doctor on Monday return to the ER if develop worsening symptoms or any other concerns  Your liver function tests are slightly elevated and you should not use more than 2 g of Tylenol in the day.  She started up with ibuprofen 600 mg every 6-8 hours with food to help with fevers.  You need to have your liver tests rechecked outpatient with your primary care doctor to discuss why these are elevated.

## 2023-07-01 ENCOUNTER — Other Ambulatory Visit: Payer: Self-pay

## 2023-07-01 ENCOUNTER — Emergency Department
Admission: EM | Admit: 2023-07-01 | Discharge: 2023-07-01 | Disposition: A | Discharge status: Home / Self Care | Payer: BC Managed Care – PPO | Attending: Emergency Medicine | Admitting: Emergency Medicine

## 2023-07-01 ENCOUNTER — Emergency Department: Payer: BC Managed Care – PPO

## 2023-07-01 DIAGNOSIS — Z20822 Contact with and (suspected) exposure to covid-19: Secondary | ICD-10-CM | POA: Insufficient documentation

## 2023-07-01 DIAGNOSIS — R051 Acute cough: Secondary | ICD-10-CM | POA: Insufficient documentation

## 2023-07-01 DIAGNOSIS — R059 Cough, unspecified: Secondary | ICD-10-CM | POA: Diagnosis present

## 2023-07-01 LAB — GROUP A STREP BY PCR: Group A Strep by PCR: NOT DETECTED

## 2023-07-01 LAB — D-DIMER, QUANTITATIVE: D-Dimer, Quant: <0.27 ug{FEU}/mL (ref 0.00–0.50)

## 2023-07-01 LAB — SARS CORONAVIRUS 2 BY RT PCR: SARS Coronavirus 2 by RT PCR: NEGATIVE

## 2023-07-01 MED ORDER — GUAIFENESIN-DM 100-10 MG/5ML PO SYRP: 5.0000 mL | ORAL_SOLUTION | ORAL | 0 refills | Status: AC | PRN

## 2023-07-01 NOTE — ED Provider Notes (Signed)
Shared visit  Cough for the past couple of days.  Does endorse coughing up some blood.  Does not smoke any cigarettes or vape nicotine.  Patient with persistent tachycardia.  Low risk Wells criteria.  Given hemoptysis will obtain a screening D-dimer but otherwise is low risk for pulmonary embolism.  Afebrile here.  COVID negative.  Group B strep negative.  Chest x-ray with no signs of pneumonia.  No signs of respiratory distress.  Most likely with viral illness if D-dimer is negative will discharge home with symptomatic treatment and close outpatient follow-up otherwise will obtain further workup for PE.   Corena Herter, MD 07/01/23 [phone]

## 2023-07-01 NOTE — ED Provider Notes (Signed)
[facility] Emergency Department Provider Note     Event Date/Time   First MD Initiated Contact with Patient 07/01/23 1457     (approximate)   History   No chief complaint on file.   HPI  Jeffery Everett is a 27 y.o. male presents to the ED for evaluation of a cough x 3 days and has been progressively worsening.  He reports he is spitting up blood tinged sputum however he does not know if it is the red coloring from a drink that he consumed.  Denies fever, sick contacts and recent travel.  Denies chest pain and shortness of breath.     Physical Exam   Triage Vital Signs: ED Triage Vitals  Encounter Vitals Group     BP 07/01/23 1425 (!) 144/114     Systolic BP Percentile --      Diastolic BP Percentile --      Pulse Rate 07/01/23 1425 (!) 108     Resp 07/01/23 1425 16     Temp 07/01/23 1425 99.2 F (37.3 C)     Temp Source 07/01/23 1425 Oral     SpO2 07/01/23 1425 95 %     Weight 07/01/23 1427 (!) 350 lb 1.5 oz (158.8 kg)     Height --      Head Circumference --      Peak Flow --      Pain Score 07/01/23 1426 0     Pain Loc --      Pain Education --      Exclude from Growth Chart --     Most recent vital signs: Vitals:   07/01/23 1629 07/01/23 1722  BP: (!) 124/91 (!) 140/97  Pulse: (!) 109 (!) 105  Resp: 16 18  Temp: 98.9 F (37.2 C)   SpO2: 96% 98%   General: Alert and oriented. INAD.  Skin:  Warm, dry and intact. No rashes or lesions noted.     Head:  NCAT.  Eyes:  PERRLA. EOMI.  Nose:   Mucosa is moist. No rhinorrhea. Throat: Oropharynx clear. Mild erythema. Tonsils not enlarged. Uvula is midline. CV:  Good peripheral perfusion. RRR.  RESP:  Normal effort. LCTAB.  No wheezes or rhonchi.  No retractions.  ABD:  No distention.  ED Results / Procedures / Treatments   Labs (all labs ordered are listed, but only abnormal results are displayed) Labs Reviewed  SARS CORONAVIRUS 2 BY RT PCR  GROUP A STREP BY PCR  D-DIMER,  QUANTITATIVE   RADIOLOGY  I personally viewed and evaluated these images as part of my medical decision making, as well as reviewing the written report by the radiologist.  ED Provider Interpretation: This x-ray appears normal.  No pneumonia  DG Chest 1 View  Result Date: 07/01/2023 CLINICAL DATA:  Cough for 3 days worsening today, hemoptysis EXAM: CHEST  1 VIEW COMPARISON:  03/18/2022 FINDINGS: Single frontal view of the chest demonstrates a stable cardiac silhouette. No acute airspace disease, effusion, or pneumothorax. No acute bony abnormalities. IMPRESSION: 1. No acute intrathoracic process. Electronically Signed   By: Sharlet Salina M.D.   On: 07/01/2023 15:46    PROCEDURES:  Critical Care performed: No  Procedures   MEDICATIONS ORDERED IN ED: Medications - No data to display   IMPRESSION / MDM / ASSESSMENT AND PLAN / ED COURSE  I reviewed the triage vital signs and the nursing notes.  28 y.o. male presents to the emergency department for evaluation and treatment of acute cough. See HPI for further details.   Differential diagnosis includes, but is not limited to COVID, PNA, strep pharyngitis, PE  Patient's presentation is most consistent with acute complicated illness / injury requiring diagnostic workup.  Patient is alert and oriented.  He is mildly tachycardic with initial pulse rate of 108 and afebrile.  COVID test, rapid strep test are negative.  Physical exam findings are reassuring.  Lung exam is normal.  Chest x-ray is reassuring.  Given that he is tachycardic will obtain a D-dimer.  If D-dimer is elevated we will plan to further workup for PE.  D-dimer is negative.  And pulse rate did trend down 108 --> 105 just fairly consistent with his previous vitals. The patient is in stable and satisfactory condition for discharge home. Encouraged to follow up with his primary care for further management.  A list has been provided for him.  ED  precautions discussed. All questions and concerns were addressed during this ED visit.     FINAL CLINICAL IMPRESSION(S) / ED DIAGNOSES   Final diagnoses:  Acute cough   Rx / DC Orders   ED Discharge Orders          Ordered    guaiFENesin-dextromethorphan (ROBITUSSIN DM) 100-10 MG/5ML syrup  Every 4 hours PRN        07/01/23 1747            Note:  This document was prepared using Dragon voice recognition software and may include unintentional dictation errors.    Romeo Apple, Jahnai Slingerland A, PA-C 07/01/23 1801    Corena Herter, MD 07/01/23 2106

## 2023-07-01 NOTE — ED Notes (Signed)
Pt verbalizes understanding of discharge instructions. Opportunity for questioning and answers were provided. Pt discharged from ED to home.   ? ?

## 2023-07-01 NOTE — ED Triage Notes (Signed)
C/O coughing fits today.  Cough is productively, early today sputum was blood tinged.  Cough started Friday morning.  C?O chills at times.  AAOx3.  Skin warm and dry. No SOB/DOE. NAD

## 2023-07-01 NOTE — Discharge Instructions (Addendum)
Your evaluated in the ED for a cough.  Your respiratory panel which includes COVID, flu and RSV is negative.  Your strep test is negative.  Your chest x-ray does not reveal pneumonia.  Your D-dimer is also normal.  Please review cough patient education packet attached to your discharge paper.  Take Tylenol and ibuprofen for pain as needed.  Get plenty of rest and stay hydrated.  Follow-up with your primary care in 1 week if symptoms persist.  A list has been provided for you below.  If you feel any shortness of breath, chest pain or vomiting blood please return to ED for further reevaluation.  Please go to the following website to schedule new (and existing) patient appointments:   [email or web address]   The following is a list of primary care offices in the area who are accepting new patients at this time.  Please reach out to one of them directly and let them know you would like to schedule an appointment to follow up on an Emergency Department visit, and/or to establish a new primary care provider (PCP).  There are likely other primary care clinics in the are who are accepting new patients, but this is an excellent place to start:  [facility] Lead physician: Dr Shirlee Latch [address] [place], [place] [address] [phone]  [facility] Lead Physician: Dr Alba Cory [address], [place], [place] [address] [phone]  [facility]  Lead Physician: Dr Olevia Perches [address] [place], [place] [address] [phone]  [facility] Lead Physician: Dr Sofie Hartigan [address], [place], [place] [address] [phone]  [facility] at [facility] Lead Physician: Dr Bari Edward [address] [place], [place], [place] [address] [phone]
